# Patient Record
Sex: Female | Born: 1945 | Race: White | Hispanic: No | Marital: Married | Smoking: Never smoker
Health system: Southern US, Community
[De-identification: ages and names within clinical notes are randomized; demographics above are authoritative.]

## PROBLEM LIST (undated history)

## (undated) DIAGNOSIS — F419 Anxiety disorder, unspecified: Secondary | ICD-10-CM

## (undated) DIAGNOSIS — K219 Gastro-esophageal reflux disease without esophagitis: Secondary | ICD-10-CM

## (undated) DIAGNOSIS — G43909 Migraine, unspecified, not intractable, without status migrainosus: Secondary | ICD-10-CM

## (undated) DIAGNOSIS — I10 Essential (primary) hypertension: Secondary | ICD-10-CM

## (undated) DIAGNOSIS — E039 Hypothyroidism, unspecified: Secondary | ICD-10-CM

## (undated) HISTORY — DX: Essential (primary) hypertension: I10

## (undated) HISTORY — DX: Anxiety disorder, unspecified: F41.9

## (undated) HISTORY — PX: APPENDECTOMY: SHX54

## (undated) HISTORY — PX: TUBAL LIGATION: SHX77

## (undated) HISTORY — PX: TONSILLECTOMY: SUR1361

## (undated) HISTORY — PX: ANKLE FRACTURE SURGERY: SHX122

## (undated) HISTORY — DX: Migraine, unspecified, not intractable, without status migrainosus: G43.909

## (undated) HISTORY — PX: CHOLECYSTECTOMY: SHX55

---

## 2002-01-26 ENCOUNTER — Inpatient Hospital Stay (HOSPITAL_COMMUNITY): Admission: EM | Admit: 2002-01-26 | Discharge: 2002-01-28 | Payer: Self-pay | Admitting: Psychiatry

## 2004-01-25 ENCOUNTER — Ambulatory Visit (HOSPITAL_COMMUNITY): Admission: RE | Admit: 2004-01-25 | Discharge: 2004-01-25 | Payer: Self-pay | Admitting: Internal Medicine

## 2004-08-14 ENCOUNTER — Ambulatory Visit: Payer: Self-pay | Admitting: Cardiology

## 2005-09-20 ENCOUNTER — Encounter: Admission: RE | Admit: 2005-09-20 | Discharge: 2005-09-20 | Payer: Self-pay | Admitting: General Surgery

## 2006-03-28 ENCOUNTER — Encounter: Admission: RE | Admit: 2006-03-28 | Discharge: 2006-03-28 | Payer: Self-pay | Admitting: General Surgery

## 2007-04-24 ENCOUNTER — Encounter: Admission: RE | Admit: 2007-04-24 | Discharge: 2007-04-24 | Payer: Self-pay | Admitting: *Deleted

## 2007-04-29 ENCOUNTER — Encounter: Admission: RE | Admit: 2007-04-29 | Discharge: 2007-04-29 | Payer: Self-pay | Admitting: *Deleted

## 2007-12-04 ENCOUNTER — Encounter: Admission: RE | Admit: 2007-12-04 | Discharge: 2007-12-04 | Payer: Self-pay | Admitting: *Deleted

## 2008-08-02 ENCOUNTER — Ambulatory Visit: Payer: Self-pay | Admitting: Cardiology

## 2009-02-10 ENCOUNTER — Encounter: Admission: RE | Admit: 2009-02-10 | Discharge: 2009-02-10 | Payer: Self-pay | Admitting: Unknown Physician Specialty

## 2009-03-29 ENCOUNTER — Encounter: Admission: RE | Admit: 2009-03-29 | Discharge: 2009-03-29 | Payer: Self-pay | Admitting: Unknown Physician Specialty

## 2009-12-18 ENCOUNTER — Ambulatory Visit (HOSPITAL_COMMUNITY): Admission: RE | Admit: 2009-12-18 | Discharge: 2009-12-18 | Payer: Self-pay | Admitting: Ophthalmology

## 2010-04-13 ENCOUNTER — Encounter: Admission: RE | Admit: 2010-04-13 | Discharge: 2010-04-13 | Payer: Self-pay | Admitting: Unknown Physician Specialty

## 2010-10-07 ENCOUNTER — Encounter: Payer: Self-pay | Admitting: Family Medicine

## 2010-10-08 ENCOUNTER — Encounter: Payer: Self-pay | Admitting: Unknown Physician Specialty

## 2011-02-01 NOTE — Op Note (Signed)
NAME:  Amanda Bowman                           ACCOUNT NO.:  [id number]   MEDICAL RECORD NO.:  [id number]                   PATIENT TYPE:  AMB   LOCATION:  DAY                                  FACILITY:  [facility]   PHYSICIAN:  Lionel December, M.D.                 DATE OF BIRTH:  10/04/1945   DATE OF PROCEDURE:  01/25/2004  DATE OF DISCHARGE:                                 OPERATIVE REPORT   PROCEDURE:  Esophagogastroduodenoscopy followed by total colonoscopy.   ENDOSCOPIST:  Lionel December, M.D.   INDICATIONS:  This patient is a 65 year old Caucasian female who has a  chronic GERD requiring PPI for symptom control. She was, therefore, advised  to undergo EGD to make sure that she does not have complicated GERD such as  Barrett's esophagus. She has chronic diarrhea with explosive bowel movements  and has had multiple accidents.  I feel that she has IBS, but she has not  responded well to therapy.  She is also in an age group where she needs to  be screened for colorectal carcinoma.  She is, therefore, undergoing a  colonoscopy.  Procedure and risks were reviewed with the patient and  informed consent was obtained.   PREOPERATIVE MEDICATIONS:  Cetacaine spray for oropharyngeal topical  anesthesia, Demerol 50 mg IV and Versed 13 mg IV in divided dose.   FINDINGS:  Procedure performed in endoscopy suite.  The patient's vital  signs and O2 saturation were monitored during the procedure and remained  stable.   PROCEDURE #1: ESOPHAGOGASTRODUODENOSCOPY:  The patient was placed in the  left lateral recumbent position and Olympus videoscope was passed via the  oropharynx without any difficulty into the esophagus.   ESOPHAGUS:  Mucosa of the esophagus was normal.  Some erythema was noted at  the GE junction along with a 3 cm size sliding hiatal hernia.   STOMACH:  It had a lot of bile in it, but there was no food debris.  The  stomach distended very well with insufflation.  The mucosa at  body, antrum,  pyloric channel as well as angularis, fundus, and cardia were normal.   DUODENUM:  Examination of the bulb and second part of the duodenum was also  normal.   Endoscope was withdrawn and the patient was prepared for procedure #2.   COLONOSCOPY:  Rectal examination performed.  No abnormality noted on  external or digital exam.   Olympus videoscope  was placed in the rectum and advanced under vision into  the sigmoid colon.  Her preparation was felt to fair-to-satisfactory.  She  had a lot of thick liquid stool.  The scope was advanced to the cecum which  was identified by appendiceal stump and ileocecal valve.  Pictures were  taken for the record. There appeared to be mucosal edema at the ascending  colon, transverse, as well as descending and sigmoid colon.  Biopsy was  taken from the ascending colon and sigmoid colon and submitted in separate  containers.  Also I wonder if this abnormal appearance was due to  pigmentation, although this was note felt to be typical.  There was a 3 mm  polyp at transverse colon which was ablated by a cold biopsy.  Rectal mucosa  was normal.   The scope was retroflexed to examine anorectal junction and small  hemorrhoids were noted below the dentate line.  The endoscope was  straightened and withdrawn.  The patient tolerated the procedure well.   FINAL DIAGNOSES:  1. Mild changes of reflux esophagitis limited to the gastroesophageal     junction and a small sliding hiatal hernia.  2. Small polyp ablated by a cold biopsy from the transverse colon.  3. Abnormal appearance to colonic mucosa, possibly indicative of mild     colitis. These changes are not felt to be atypical of pigmentation.     Biopsies taken from ascending and sigmoid colon.  4. Small external hemorrhoids.   RECOMMENDATIONS:  She will continue antireflux measures and Prevacid as  before.  I would like for her to stay on dicyclomine and try to start  Citrucel 1/2 to 1  tablespoonful q.h.s.  Further recommendations will be made  after biopsy reviewed.  If she does have mild change of colitis we will  start her on Asacol, otherwise would consider Lotronex.      ___________________________________________                                            Lionel December, M.D.   NR/MEDQ  D:  01/25/2004  T:  01/25/2004  Job:  [id number]   cc:   Donzetta Sprung  [address], [address]  [place]  [place] [address]  Fax: [phone]

## 2011-02-01 NOTE — H&P (Signed)
[facility]  Patient:    Amanda Bowman, Amanda Bowman Visit Number: [id number] MRN: [id number]          Service Type: PSY Location: 300 [id number] Attending Physician:  Rachael Fee Dictated by:   Candi Leash. Orsini, N.P. Admit Date:  01/27/2002                     Psychiatric Admission Assessment  IDENTIFYING INFORMATION:  This is a 65 year old married white female voluntarily admitted for depression on Jan 26, 2002.  HISTORY OF PRESENT ILLNESS:  The patient presents with a history of depression, feeling "unhappy for some time", making errors at work, feeling confused, having short-term memory loss that she feels is related to her medication.  She feels very sad and blue.  She reports that her work has not changed but she was told she did not get along with other employees and "snapping" at her husband.  Her sleep and appetite has been satisfactory.  She denies any suicidal or homicidal ideation or auditory or visual hallucinations.  Some paranoid ideation, feeling that her co-workers are laughing at her and she feels that they want to get rid of her at work.  She reports she has had a recent MRI in April due to her confusion.  PAST PSYCHIATRIC HISTORY:  First hospitalization to [facility]. No other hospitalizations.  Was seeing Malachi Bonds ______, a therapist, but not currently.  No prior suicide attempts.  SOCIAL HISTORY:  This is a 65 year old married white female.  This is her third marriage.  Married for 13 years.  She has a son and an adult stepson. She lives with her husband.  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  No legal problems.  FAMILY HISTORY:  Father committed suicide when patient was in her 10s.  ALCOHOL/DRUG HISTORY:  Nonsmoker.  Denies any alcohol or substance abuse.  PRIMARY CARE Miciah Shealy:  Dr. Donzetta Sprung of [employer].  MEDICAL PROBLEMS:  Hypertension, GERD, hypercholesterolemia, migraine headaches.  MEDICATIONS:   Atenolol 100 mg every day, aspirin 325 mg q.d., multivitamin, Prevacid 30 mg q.d., Prempro 0.625/2.5 q.d., Norvasc 5 mg q.d., Effexor 150 mg every day (has been on that for one year), Zocor 20 mg q.h.s., Topamax 25 mg, 2 tabs b.i.d., Xanax 0.5 mg t.i.d. p.r.n. (states takes 1-2 per day).  DRUG ALLERGIES:  No known allergies.  PHYSICAL EXAMINATION:  VITAL SIGNS:  The patient is 5 feet 7-1/2 inches tall.  She is 210 pounds. Temperature 97.6, pulse 87, respirations 16, blood pressure 142/74.  GENERAL APPEARANCE:  The patient is a 65 year old Caucasian female in no acute distress.  She is well-developed and appears her stated age.  She is well-groomed, alert and cooperative.  HEENT:  Head is normocephalic.  She can raise her eyebrows.  Her hair is short, blond with some graying, equally distributed.  EOMs are intact bilaterally.  External ear canals are patent.  Hearing is appropriate to conversation.  No sinus tenderness.  No nasal discharge.  Mucosa is moist. Tongue protrudes midline without tremor.  She can clench her teeth and puff out her cheeks.  NECK:  Supple.  No JVD.  Negative lymphadenopathy.  Thyroid is nonpalpable, nontender.  Trachea is midline.  CHEST:  Clear to auscultation.  HEART:  Regular rate and rhythm without murmurs, gallops or rubs.  Carotid pulses are equal and adequate.  BREASTS:  Exam was deferred.  ABDOMEN:  Soft, nontender abdomen.  No CVA tenderness.  MUSCULOSKELETAL:  No joint swelling or deformity.  Good range of motion. Muscle strength and tone is equal bilaterally.  There is no signs of injury.  SKIN:  Warm and dry with good turgor.  Nail beds are pink with good capillary refill.  NEUROLOGIC:  She is oriented x 3.  Cranial nerves are grossly intact.  Good grip strength bilaterally.  Gait is normal.  Cerebellar function intact with heel to shin and normal alternating movements.  LABORATORY DATA:  CBC is within normal limits.  CMET is within  normal limits.  MENTAL STATUS EXAMINATION:  She is an alert, middle-aged, casually dressed female, cooperative with good eye contact.  Speech is normal and relevant. Mood is depressed.  Affect is flat.  Thought processes are coherent.  Some positive paranoid ideation presented during patients interview in regards to her work.  No auditory or visual hallucinations.  No suicidal or homicidal ideation.  No delusions.  Cognitive function intact.  Memory is fair. Judgment and insight are fair.  DIAGNOSES: Axis I:    Major depression, single episode with psychotic features. Axis II:   Deferred. Axis III:  1. Hypertension.            2. Migraine headaches.            3. Gastroesophageal reflux disease. Axis IV:   Problems with occupation. Axis V:    Current 40; estimated this past year 61.  PLAN:  Voluntary admission to [facility] for depression. Contract for safety.  Check every 15 minutes.  Will decrease her depressive symptoms so patient can be safe.  Will resume her routine medications.  Will check her blood pressure and check labs.  We will decrease patients Effexor and treatment plan was discussed with Dr. Dub Mikes.  Will have a family session before discharge.  TENTATIVE LENGTH OF STAY:  Three to five days. Dictated by:   Candi Leash. Orsini, N.P. Attending Physician:  Rachael Fee DD:  01/27/02 TD:  01/27/02 Job: [id number]

## 2011-02-01 NOTE — Discharge Summary (Signed)
[facility]  Patient:    Amanda Bowman, TISON Visit Number: [id number] MRN: [id number]          Service Type: PSY Location: 300 [id number] Attending Physician:  Rachael Fee Dictated by:   Reymundo Poll Dub Mikes, M.D. Admit Date:  01/26/2002 Discharge Date: 01/28/2002                             Discharge Summary  CHIEF COMPLAINT AND HISTORY OF PRESENT ILLNESS:  This was the first admission to [facility] for this 65 year old married white female voluntarily admitted for depression.  History of depression, feeling unhappy for a while, making errors at work, feeling confused, short-term memory loss, feels sad, blue, not getting along with other employees, snapping at the husband, sleeping and appetite changes, but denied any active suicidal or homicidal ideations.  She was feeling desperate as she was in danger of losing her job as well as her family, so she requested help.  Was feeling that the coworkers were laughing at her.  PAST PSYCHIATRIC HISTORY:  No previous inpatient treatment.  Has seen Byrd Hesselbach for therapy.  No medications.  SUBSTANCE ABUSE HISTORY:  Denies the use or abuse of any alcohol or drugs.  PAST MEDICAL HISTORY:  Positive for hypertension, gastroesophageal reflux disease, hypercholesterolemia, migraines.  MEDICATIONS ON ADMISSION:  1. Atenolol 100 mg per day.  2. Aspirin one daily.  3. Multivitamin.  4. Prevacid 30 mg every day.  5. Prempro 0.625/2.5 mg.  6. Norvasc 5 mg daily.  7. Effexor XR 150 mg daily for a year.  8. Zocor 20 mg daily.  9. Topamax 50 mg twice a day. 10. Xanax 0.5 mg three a day as needed for anxiety.  PHYSICAL EXAMINATION:  GENERAL:  Performed and failed to show any acute findings.  MENTAL STATUS EXAMINATION ON ADMISSION:  Well-nourished, well-developed, alert, cooperative female.  Mood: Depressed.  Affect: Depressed.  Thought processes: Coherent; no evidence of psychosis, no  auditory or visual hallucinations, no suicidal ideas, but there was a sense of hopelessness, helplessness, feeling overwhelmed, out of control, not knowing what to do to get herself better.  Cognitive: Well preserved.  ADMITTING DIAGNOSES: Axis I:    Major depression, single episode. Axis II:   No diagnosis. Axis III:  1. Arterial hypertension.            2. Migraines.            3. Gastroesophageal reflux disease. Axis IV:   Moderate. Axis V:    Global assessment of functioning upon admission 40, highest global            assessment of functioning in the last year 70.  LABORATORY DATA:  CBC, urinalysis, electrolytes, liver profile, and thyroid profile were within normal limits.  Drug screen failed to show any substances of abuse.  HOSPITAL COURSE:  She was admitted and started in intensive individual and group psychotherapy.  The issue was if she was sick enough to be at this level of care.  She had not had long-term outpatient treatment.  It was more of frustration that she was not getting any better.  She, indeed, upon further questioning, had used Wellbutrin, Paxil, Zoloft, and Prozac with no benefit. She had been on Effexor 150 mg with not a very good response.  She was wanting long-term treatment and she, through her church, was able to find out there was a [employer] [employer] [facility]  [facility] where, although it was a substance abuse program, they could take people with depression.  So, she was wanting to go there to further work on herself using a religious modality.  DISCHARGE DIAGNOSES: Axis I:    Major depression, single episode. Axis II:   No diagnosis. Axis III:  1. Arterial hypertension.            2. Migraines.            3. Gastroesophageal reflux disease. Axis IV:   Moderate. Axis V:    Global assessment of functioning upon discharge 55-60.  DISCHARGE MEDICATIONS:  1. Prevacid 30 mg daily.  2. Prempro 0.625/2.5 mg daily.  3. Norvasc 5 mg daily.  4. Effexor XR 150 mg  daily.  5. Zocor 20 mg at bedtime.  6. Topamax 50 mg twice a day.  7. Xanax 0.5 mg three times a day.  8. Atenolol 100 mg daily.  9. Aspirin 325 mg daily. 10. Centrum multivitamin one daily. 11. Ambien 10 mg at bedtime for sleep.  FOLLOWUP:  We recommended to be out of work four weeks while she goes to the [employer] and follow up at [facility]. Dictated by:   Reymundo Poll Dub Mikes, M.D. Attending Physician:  Rachael Fee DD:  03/10/02 TD:  03/10/02 Job: [id number]

## 2014-10-12 DIAGNOSIS — J0100 Acute maxillary sinusitis, unspecified: Secondary | ICD-10-CM | POA: Diagnosis not present

## 2014-11-24 DIAGNOSIS — E782 Mixed hyperlipidemia: Secondary | ICD-10-CM | POA: Diagnosis not present

## 2014-11-24 DIAGNOSIS — E8881 Metabolic syndrome: Secondary | ICD-10-CM | POA: Diagnosis not present

## 2014-11-24 DIAGNOSIS — I10 Essential (primary) hypertension: Secondary | ICD-10-CM | POA: Diagnosis not present

## 2014-11-26 DIAGNOSIS — J0100 Acute maxillary sinusitis, unspecified: Secondary | ICD-10-CM | POA: Diagnosis not present

## 2014-11-26 DIAGNOSIS — I10 Essential (primary) hypertension: Secondary | ICD-10-CM | POA: Diagnosis not present

## 2014-11-26 DIAGNOSIS — J301 Allergic rhinitis due to pollen: Secondary | ICD-10-CM | POA: Diagnosis not present

## 2014-12-01 DIAGNOSIS — F331 Major depressive disorder, recurrent, moderate: Secondary | ICD-10-CM | POA: Diagnosis not present

## 2014-12-01 DIAGNOSIS — E782 Mixed hyperlipidemia: Secondary | ICD-10-CM | POA: Diagnosis not present

## 2014-12-01 DIAGNOSIS — I10 Essential (primary) hypertension: Secondary | ICD-10-CM | POA: Diagnosis not present

## 2014-12-01 DIAGNOSIS — E8881 Metabolic syndrome: Secondary | ICD-10-CM | POA: Diagnosis not present

## 2014-12-01 DIAGNOSIS — E6609 Other obesity due to excess calories: Secondary | ICD-10-CM | POA: Diagnosis not present

## 2014-12-06 DIAGNOSIS — R928 Other abnormal and inconclusive findings on diagnostic imaging of breast: Secondary | ICD-10-CM | POA: Diagnosis not present

## 2014-12-06 DIAGNOSIS — Z1231 Encounter for screening mammogram for malignant neoplasm of breast: Secondary | ICD-10-CM | POA: Diagnosis not present

## 2014-12-14 DIAGNOSIS — M542 Cervicalgia: Secondary | ICD-10-CM | POA: Diagnosis not present

## 2014-12-14 DIAGNOSIS — N63 Unspecified lump in breast: Secondary | ICD-10-CM | POA: Diagnosis not present

## 2014-12-14 DIAGNOSIS — R928 Other abnormal and inconclusive findings on diagnostic imaging of breast: Secondary | ICD-10-CM | POA: Diagnosis not present

## 2014-12-14 DIAGNOSIS — M5137 Other intervertebral disc degeneration, lumbosacral region: Secondary | ICD-10-CM | POA: Diagnosis not present

## 2014-12-14 DIAGNOSIS — M47812 Spondylosis without myelopathy or radiculopathy, cervical region: Secondary | ICD-10-CM | POA: Diagnosis not present

## 2014-12-14 DIAGNOSIS — M545 Low back pain: Secondary | ICD-10-CM | POA: Diagnosis not present

## 2015-02-09 DIAGNOSIS — M5416 Radiculopathy, lumbar region: Secondary | ICD-10-CM | POA: Diagnosis not present

## 2015-02-27 DIAGNOSIS — L247 Irritant contact dermatitis due to plants, except food: Secondary | ICD-10-CM | POA: Diagnosis not present

## 2015-03-10 DIAGNOSIS — I10 Essential (primary) hypertension: Secondary | ICD-10-CM | POA: Diagnosis not present

## 2015-03-10 DIAGNOSIS — E782 Mixed hyperlipidemia: Secondary | ICD-10-CM | POA: Diagnosis not present

## 2015-03-23 DIAGNOSIS — F331 Major depressive disorder, recurrent, moderate: Secondary | ICD-10-CM | POA: Diagnosis not present

## 2015-03-23 DIAGNOSIS — E782 Mixed hyperlipidemia: Secondary | ICD-10-CM | POA: Diagnosis not present

## 2015-03-23 DIAGNOSIS — I10 Essential (primary) hypertension: Secondary | ICD-10-CM | POA: Diagnosis not present

## 2015-03-23 DIAGNOSIS — E8881 Metabolic syndrome: Secondary | ICD-10-CM | POA: Diagnosis not present

## 2015-03-23 DIAGNOSIS — E6609 Other obesity due to excess calories: Secondary | ICD-10-CM | POA: Diagnosis not present

## 2015-03-31 DIAGNOSIS — E871 Hypo-osmolality and hyponatremia: Secondary | ICD-10-CM | POA: Diagnosis not present

## 2015-03-31 DIAGNOSIS — E8881 Metabolic syndrome: Secondary | ICD-10-CM | POA: Diagnosis not present

## 2015-03-31 DIAGNOSIS — I10 Essential (primary) hypertension: Secondary | ICD-10-CM | POA: Diagnosis not present

## 2015-04-21 DIAGNOSIS — E871 Hypo-osmolality and hyponatremia: Secondary | ICD-10-CM | POA: Diagnosis not present

## 2015-07-04 DIAGNOSIS — H40053 Ocular hypertension, bilateral: Secondary | ICD-10-CM | POA: Diagnosis not present

## 2015-07-04 DIAGNOSIS — Z23 Encounter for immunization: Secondary | ICD-10-CM | POA: Diagnosis not present

## 2015-08-07 DIAGNOSIS — E8881 Metabolic syndrome: Secondary | ICD-10-CM | POA: Diagnosis not present

## 2015-08-07 DIAGNOSIS — K219 Gastro-esophageal reflux disease without esophagitis: Secondary | ICD-10-CM | POA: Diagnosis not present

## 2015-08-07 DIAGNOSIS — E871 Hypo-osmolality and hyponatremia: Secondary | ICD-10-CM | POA: Diagnosis not present

## 2015-08-07 DIAGNOSIS — E782 Mixed hyperlipidemia: Secondary | ICD-10-CM | POA: Diagnosis not present

## 2015-08-07 DIAGNOSIS — I10 Essential (primary) hypertension: Secondary | ICD-10-CM | POA: Diagnosis not present

## 2015-08-08 DIAGNOSIS — E782 Mixed hyperlipidemia: Secondary | ICD-10-CM | POA: Diagnosis not present

## 2015-08-19 DIAGNOSIS — Z0001 Encounter for general adult medical examination with abnormal findings: Secondary | ICD-10-CM | POA: Diagnosis not present

## 2015-08-19 DIAGNOSIS — F331 Major depressive disorder, recurrent, moderate: Secondary | ICD-10-CM | POA: Diagnosis not present

## 2015-08-31 DIAGNOSIS — J019 Acute sinusitis, unspecified: Secondary | ICD-10-CM | POA: Diagnosis not present

## 2015-10-17 DIAGNOSIS — M5416 Radiculopathy, lumbar region: Secondary | ICD-10-CM | POA: Diagnosis not present

## 2015-10-17 DIAGNOSIS — M5410 Radiculopathy, site unspecified: Secondary | ICD-10-CM | POA: Diagnosis not present

## 2015-10-25 DIAGNOSIS — M1712 Unilateral primary osteoarthritis, left knee: Secondary | ICD-10-CM | POA: Diagnosis not present

## 2015-10-25 DIAGNOSIS — M545 Low back pain: Secondary | ICD-10-CM | POA: Diagnosis not present

## 2015-10-25 DIAGNOSIS — M47817 Spondylosis without myelopathy or radiculopathy, lumbosacral region: Secondary | ICD-10-CM | POA: Diagnosis not present

## 2015-10-31 ENCOUNTER — Ambulatory Visit (HOSPITAL_COMMUNITY): Payer: Self-pay | Admitting: Psychiatry

## 2015-11-06 ENCOUNTER — Ambulatory Visit (HOSPITAL_COMMUNITY): Payer: Self-pay | Admitting: Psychiatry

## 2015-12-04 DIAGNOSIS — J0101 Acute recurrent maxillary sinusitis: Secondary | ICD-10-CM | POA: Diagnosis not present

## 2015-12-04 DIAGNOSIS — R197 Diarrhea, unspecified: Secondary | ICD-10-CM | POA: Diagnosis not present

## 2015-12-05 ENCOUNTER — Encounter (INDEPENDENT_AMBULATORY_CARE_PROVIDER_SITE_OTHER): Payer: Self-pay | Admitting: *Deleted

## 2015-12-22 ENCOUNTER — Ambulatory Visit (INDEPENDENT_AMBULATORY_CARE_PROVIDER_SITE_OTHER): Payer: Self-pay | Admitting: Internal Medicine

## 2015-12-25 DIAGNOSIS — E782 Mixed hyperlipidemia: Secondary | ICD-10-CM | POA: Diagnosis not present

## 2015-12-25 DIAGNOSIS — I10 Essential (primary) hypertension: Secondary | ICD-10-CM | POA: Diagnosis not present

## 2015-12-28 ENCOUNTER — Other Ambulatory Visit (INDEPENDENT_AMBULATORY_CARE_PROVIDER_SITE_OTHER): Payer: Self-pay | Admitting: *Deleted

## 2015-12-28 ENCOUNTER — Encounter (INDEPENDENT_AMBULATORY_CARE_PROVIDER_SITE_OTHER): Payer: Self-pay | Admitting: Internal Medicine

## 2015-12-28 ENCOUNTER — Encounter (INDEPENDENT_AMBULATORY_CARE_PROVIDER_SITE_OTHER): Payer: Self-pay

## 2015-12-28 ENCOUNTER — Encounter (INDEPENDENT_AMBULATORY_CARE_PROVIDER_SITE_OTHER): Payer: Self-pay | Admitting: *Deleted

## 2015-12-28 ENCOUNTER — Other Ambulatory Visit (INDEPENDENT_AMBULATORY_CARE_PROVIDER_SITE_OTHER): Payer: Self-pay | Admitting: Internal Medicine

## 2015-12-28 ENCOUNTER — Ambulatory Visit (INDEPENDENT_AMBULATORY_CARE_PROVIDER_SITE_OTHER): Payer: Medicare Other | Admitting: Internal Medicine

## 2015-12-28 DIAGNOSIS — K529 Noninfective gastroenteritis and colitis, unspecified: Secondary | ICD-10-CM | POA: Diagnosis not present

## 2015-12-28 DIAGNOSIS — I10 Essential (primary) hypertension: Secondary | ICD-10-CM | POA: Insufficient documentation

## 2015-12-28 DIAGNOSIS — K219 Gastro-esophageal reflux disease without esophagitis: Secondary | ICD-10-CM | POA: Insufficient documentation

## 2015-12-28 DIAGNOSIS — F411 Generalized anxiety disorder: Secondary | ICD-10-CM | POA: Insufficient documentation

## 2015-12-28 MED ORDER — PEG 3350-KCL-NA BICARB-NACL 420 G PO SOLR: 4000.0000 mL | Freq: Once | ORAL | Status: AC

## 2015-12-28 MED ORDER — DICYCLOMINE HCL 10 MG PO CAPS: 10.0000 mg | ORAL_CAPSULE | Freq: Two times a day (BID) | ORAL | Status: DC

## 2015-12-28 NOTE — Progress Notes (Signed)
Subjective:    Patient ID: Amanda Bowman, female    DOB: 1946-07-07, 70 y.o.   MRN: [id number]  HPI Referred by Dr. Olena Heckle for chronic diarrhea. She has had diarrhea "off and on " for years. She has urgency. She underwent a colonoscopy about 5 yrs by Dr. Nada Maclachlan and was normal . She tells me she had polyps. I will get records. She wears a Depend at night. She has incontinence at night. This is a new symptoms. She has been wearing Depends off and on for about 5 yrs.  She usually has a BM x 1 a day and sometimes she will skip a day. Sometimes she will have 3 BMs.  Appetite is okay. There has been no weight loss.  There is no abdominal pain.  No recent antibiotics.  If she tries Imodium she will become constipated She has been under a lot of stress in her personal life.    01/25/2004:  Esophagogastroduodenoscopy followed by total colonoscopy.    ENDOSCOPIST:  Hildred Laser, M.D.    INDICATIONS:  This patient is a 70 year old Caucasian female who has a  chronic GERD requiring PPI for symptom control. She was, therefore, advised  to undergo EGD to make sure that she does not have complicated GERD such as  Barrett's esophagus. She has chronic diarrhea with explosive bowel movements  and has had multiple accidents.  I feel that she has IBS, but she has not  responded well to therapy.  She is also in an age group where she needs to  be screened for colorectal carcinoma.     colonoscopy.       FINAL DIAGNOSES:  1. Mild changes of reflux esophagitis limited to the gastroesophageal     junction and a small sliding hiatal hernia.  2. Small polyp ablated by a cold biopsy from the transverse colon.  3. Abnormal appearance to colonic mucosa, possibly indicative of mild     colitis. These changes are not felt to be atypical of pigmentation.     Biopsies taken from ascending and sigmoid colon.  4. Small external hemorrhoids.   Review of Systems Past Medical History  Diagnosis Date  .  Hypertension   . Migraine   . Anxiety     Past Surgical History  Procedure Laterality Date  . Tonsillectomy    . Tubal ligation    . Ankle fracture surgery    . Cholecystectomy    . Appendectomy      Not on File  No current outpatient prescriptions on file prior to visit.   No current facility-administered medications on file prior to visit.   Current Outpatient Prescriptions  Medication Sig Dispense Refill  . ALPRAZolam (XANAX) 0.5 MG tablet Take 0.5 mg by mouth at bedtime as needed for anxiety.    Marland Kitchen aspirin 81 MG tablet Take 81 mg by mouth daily.    . citalopram (CELEXA) 20 MG tablet Take 20 mg by mouth daily.    . cyclobenzaprine (FLEXERIL) 10 MG tablet Take 10 mg by mouth 3 (three) times daily as needed for muscle spasms.    Marland Kitchen levothyroxine (SYNTHROID, LEVOTHROID) 25 MCG tablet Take 25 mcg by mouth daily before breakfast.    . lisinopril-hydrochlorothiazide (PRINZIDE,ZESTORETIC) 20-12.5 MG tablet Take 1 tablet by mouth daily.    . meclizine (ANTIVERT) 25 MG tablet Take 25 mg by mouth 3 (three) times daily as needed for dizziness.    . Multiple Vitamins-Minerals (WOMENS MULTI VITAMIN & MINERAL) TABS Take by mouth.    Marland Kitchen  omeprazole (PRILOSEC) 20 MG capsule Take 20 mg by mouth 2 (two) times daily before a meal.    . promethazine (PHENERGAN) 25 MG tablet Take 25 mg by mouth every 6 (six) hours as needed for nausea or vomiting.    . topiramate (TOPAMAX) 25 MG capsule Take 25 mg by mouth 2 (two) times daily.    . traZODone (DESYREL) 50 MG tablet Take 200 mg by mouth.     No current facility-administered medications for this visit.         Objective:   Physical ExamBlood pressure 150/82, pulse 64, temperature 98.2 F (36.8 C), height 5' 7.5" (1.715 m), weight 170 lb 1.6 oz (77.157 kg). Alert and oriented. Skin warm and dry. Oral mucosa is moist.   . Sclera anicteric, conjunctivae is pink. Thyroid not enlarged. No cervical lymphadenopathy. Lungs clear. Heart regular rate and  rhythm.  Abdomen is soft. Bowel sounds are positive. No hepatomegaly. No abdominal masses felt. No tenderness.  No edema to lower extremities.          Assessment & Plan:  Probable IBS. She would like to proceed with a colonoscopy. She says her symptoms have worsened. Same symptoms for year. The risks and benefits such as perforation, bleeding, and infection were reviewed with the patient and is agreeable.

## 2015-12-28 NOTE — Telephone Encounter (Signed)
Patient needs trilyte 

## 2015-12-28 NOTE — Patient Instructions (Signed)
Colonoscopy.  The risks and benefits such as perforation, bleeding, and infection were reviewed with the patient and is agreeable. 

## 2016-01-03 DIAGNOSIS — N6002 Solitary cyst of left breast: Secondary | ICD-10-CM | POA: Diagnosis not present

## 2016-01-03 DIAGNOSIS — N63 Unspecified lump in breast: Secondary | ICD-10-CM | POA: Diagnosis not present

## 2016-01-03 DIAGNOSIS — R42 Dizziness and giddiness: Secondary | ICD-10-CM | POA: Diagnosis not present

## 2016-01-08 ENCOUNTER — Telehealth (INDEPENDENT_AMBULATORY_CARE_PROVIDER_SITE_OTHER): Payer: Self-pay | Admitting: Internal Medicine

## 2016-01-08 NOTE — Telephone Encounter (Signed)
Patient called, states she is scheduled for a colonoscopy on June 7 at 1:10pm.  Stated the medication you gave her on her last visit has helped tremendously. She doesn't have insurance or the funds to have the colonoscopy and wants to know if you are ok with her cancelling it.  She would like a return call.  [phone]

## 2016-01-09 NOTE — Telephone Encounter (Signed)
TCS canceled 

## 2016-01-09 NOTE — Telephone Encounter (Signed)
Patient would like to cancel colonoscopy.  

## 2016-01-23 ENCOUNTER — Encounter (INDEPENDENT_AMBULATORY_CARE_PROVIDER_SITE_OTHER): Payer: Self-pay

## 2016-01-29 DIAGNOSIS — E782 Mixed hyperlipidemia: Secondary | ICD-10-CM | POA: Diagnosis not present

## 2016-01-29 DIAGNOSIS — I10 Essential (primary) hypertension: Secondary | ICD-10-CM | POA: Diagnosis not present

## 2016-02-21 ENCOUNTER — Ambulatory Visit (HOSPITAL_COMMUNITY): Admit: 2016-02-21 | Payer: Self-pay | Admitting: Internal Medicine

## 2016-02-21 ENCOUNTER — Encounter (HOSPITAL_COMMUNITY): Payer: Self-pay

## 2016-02-21 SURGERY — COLONOSCOPY
Anesthesia: Moderate Sedation

## 2016-03-01 DIAGNOSIS — M1712 Unilateral primary osteoarthritis, left knee: Secondary | ICD-10-CM | POA: Diagnosis not present

## 2016-03-20 DIAGNOSIS — M19042 Primary osteoarthritis, left hand: Secondary | ICD-10-CM | POA: Diagnosis not present

## 2016-03-20 DIAGNOSIS — M19041 Primary osteoarthritis, right hand: Secondary | ICD-10-CM | POA: Diagnosis not present

## 2016-05-01 DIAGNOSIS — E782 Mixed hyperlipidemia: Secondary | ICD-10-CM | POA: Diagnosis not present

## 2016-05-01 DIAGNOSIS — I10 Essential (primary) hypertension: Secondary | ICD-10-CM | POA: Diagnosis not present

## 2016-05-09 DIAGNOSIS — M5116 Intervertebral disc disorders with radiculopathy, lumbar region: Secondary | ICD-10-CM | POA: Diagnosis not present

## 2016-05-09 DIAGNOSIS — M545 Low back pain: Secondary | ICD-10-CM | POA: Diagnosis not present

## 2016-05-31 DIAGNOSIS — J0100 Acute maxillary sinusitis, unspecified: Secondary | ICD-10-CM | POA: Diagnosis not present

## 2016-06-17 DIAGNOSIS — H40053 Ocular hypertension, bilateral: Secondary | ICD-10-CM | POA: Diagnosis not present

## 2016-06-19 DIAGNOSIS — Z23 Encounter for immunization: Secondary | ICD-10-CM | POA: Diagnosis not present

## 2016-09-02 DIAGNOSIS — E782 Mixed hyperlipidemia: Secondary | ICD-10-CM | POA: Diagnosis not present

## 2016-09-02 DIAGNOSIS — I10 Essential (primary) hypertension: Secondary | ICD-10-CM | POA: Diagnosis not present

## 2016-09-02 DIAGNOSIS — Z1212 Encounter for screening for malignant neoplasm of rectum: Secondary | ICD-10-CM | POA: Diagnosis not present

## 2016-09-02 DIAGNOSIS — Z23 Encounter for immunization: Secondary | ICD-10-CM | POA: Diagnosis not present

## 2016-09-02 DIAGNOSIS — Z0001 Encounter for general adult medical examination with abnormal findings: Secondary | ICD-10-CM | POA: Diagnosis not present

## 2016-09-25 DIAGNOSIS — J0110 Acute frontal sinusitis, unspecified: Secondary | ICD-10-CM | POA: Diagnosis not present

## 2016-11-01 DIAGNOSIS — J019 Acute sinusitis, unspecified: Secondary | ICD-10-CM | POA: Diagnosis not present

## 2016-11-01 DIAGNOSIS — R05 Cough: Secondary | ICD-10-CM | POA: Diagnosis not present

## 2017-01-08 DIAGNOSIS — N6322 Unspecified lump in the left breast, upper inner quadrant: Secondary | ICD-10-CM | POA: Diagnosis not present

## 2017-01-14 DIAGNOSIS — I10 Essential (primary) hypertension: Secondary | ICD-10-CM | POA: Diagnosis not present

## 2017-01-14 DIAGNOSIS — Z23 Encounter for immunization: Secondary | ICD-10-CM | POA: Diagnosis not present

## 2017-01-14 DIAGNOSIS — E782 Mixed hyperlipidemia: Secondary | ICD-10-CM | POA: Diagnosis not present

## 2017-02-26 DIAGNOSIS — E782 Mixed hyperlipidemia: Secondary | ICD-10-CM | POA: Diagnosis not present

## 2017-02-26 DIAGNOSIS — I10 Essential (primary) hypertension: Secondary | ICD-10-CM | POA: Diagnosis not present

## 2017-03-25 DIAGNOSIS — D485 Neoplasm of uncertain behavior of skin: Secondary | ICD-10-CM | POA: Diagnosis not present

## 2017-03-25 DIAGNOSIS — D216 Benign neoplasm of connective and other soft tissue of trunk, unspecified: Secondary | ICD-10-CM | POA: Diagnosis not present

## 2017-04-04 DIAGNOSIS — D210 Benign neoplasm of connective and other soft tissue of head, face and neck: Secondary | ICD-10-CM | POA: Diagnosis not present

## 2017-04-09 DIAGNOSIS — G43909 Migraine, unspecified, not intractable, without status migrainosus: Secondary | ICD-10-CM | POA: Diagnosis not present

## 2017-05-07 ENCOUNTER — Telehealth (INDEPENDENT_AMBULATORY_CARE_PROVIDER_SITE_OTHER): Payer: Self-pay | Admitting: Physical Medicine and Rehabilitation

## 2017-05-08 NOTE — Telephone Encounter (Signed)
Scheduled for 05/21/17 at 1300.

## 2017-05-08 NOTE — Telephone Encounter (Signed)
Yes ok unless HTA/Human etc then OV

## 2017-05-21 ENCOUNTER — Ambulatory Visit (INDEPENDENT_AMBULATORY_CARE_PROVIDER_SITE_OTHER): Payer: Self-pay

## 2017-05-21 ENCOUNTER — Ambulatory Visit (INDEPENDENT_AMBULATORY_CARE_PROVIDER_SITE_OTHER): Payer: Medicare Other | Admitting: Physical Medicine and Rehabilitation

## 2017-05-21 ENCOUNTER — Encounter (INDEPENDENT_AMBULATORY_CARE_PROVIDER_SITE_OTHER): Payer: Self-pay | Admitting: Physical Medicine and Rehabilitation

## 2017-05-21 VITALS — BP 154/94 | HR 69

## 2017-05-21 DIAGNOSIS — M5116 Intervertebral disc disorders with radiculopathy, lumbar region: Secondary | ICD-10-CM | POA: Diagnosis not present

## 2017-05-21 DIAGNOSIS — M5416 Radiculopathy, lumbar region: Secondary | ICD-10-CM | POA: Diagnosis not present

## 2017-05-21 MED ORDER — LIDOCAINE HCL (PF) 1 % IJ SOLN
2.0000 mL | Freq: Once | INTRAMUSCULAR | Status: AC
  Administered 2017-05-21: 2 mL

## 2017-05-21 MED ORDER — BETAMETHASONE SOD PHOS & ACET 6 (3-3) MG/ML IJ SUSP
12.0000 mg | Freq: Once | INTRAMUSCULAR | Status: AC
  Administered 2017-05-21: 12 mg

## 2017-05-21 NOTE — Progress Notes (Deleted)
Pain across low back and both knees. Right=left. Comes and goes. Stiffness first thing in the morning. Pain is worse after sitting long periods and goes to stand. No pain with walking. No numbness and tingling. Denies pain in thighs.

## 2017-05-21 NOTE — Patient Instructions (Signed)

## 2017-05-22 DIAGNOSIS — E871 Hypo-osmolality and hyponatremia: Secondary | ICD-10-CM | POA: Diagnosis not present

## 2017-05-22 DIAGNOSIS — I10 Essential (primary) hypertension: Secondary | ICD-10-CM | POA: Diagnosis not present

## 2017-05-22 DIAGNOSIS — E782 Mixed hyperlipidemia: Secondary | ICD-10-CM | POA: Diagnosis not present

## 2017-05-26 ENCOUNTER — Encounter (INDEPENDENT_AMBULATORY_CARE_PROVIDER_SITE_OTHER): Payer: Self-pay | Admitting: Physical Medicine and Rehabilitation

## 2017-05-26 DIAGNOSIS — E782 Mixed hyperlipidemia: Secondary | ICD-10-CM | POA: Diagnosis not present

## 2017-05-26 DIAGNOSIS — Z23 Encounter for immunization: Secondary | ICD-10-CM | POA: Diagnosis not present

## 2017-05-26 DIAGNOSIS — I10 Essential (primary) hypertension: Secondary | ICD-10-CM | POA: Diagnosis not present

## 2017-05-26 NOTE — Procedures (Signed)
Lumbosacral Transforaminal Epidural Steroid Injection - Sub-Pedicular Approach with Fluoroscopic Guidance  Patient: Amanda Bowman      Date of Birth: Sep 09, 1946 MRN: [id number] PCP: Caryl Bis, MD      Visit Date: 05/21/2017   Universal Protocol:    Date/Time: 05/21/2017  Consent Given By: the patient  Position: PRONE  Additional Comments: Vital signs were monitored before and after the procedure. Patient was prepped and draped in the usual sterile fashion. The correct patient, procedure, and site was verified.   Injection Procedure Details:  Procedure Site One Meds Administered:  Meds ordered this encounter  Medications  . lidocaine (PF) (XYLOCAINE) 1 % injection 2 mL  . betamethasone acetate-betamethasone sodium phosphate (CELESTONE) injection 12 mg    Laterality: Bilateral  Location/Site:  L4-L5  Needle size: 22 G  Needle type: Spinal  Needle Placement: Transforaminal  Findings:  -Contrast Used: 1 mL iohexol 180 mg iodine/mL   -Comments: Excellent flow of contrast along the nerve and into the epidural space.  Procedure Details: After squaring off the end-plates to get a true AP view, the C-arm was positioned so that an oblique view of the foramen as noted above was visualized. The target area is just inferior to the "nose of the scotty dog" or sub pedicular. The soft tissues overlying this structure were infiltrated with 2-3 ml. of 1% Lidocaine without Epinephrine.  The spinal needle was inserted toward the target using a "trajectory" view along the fluoroscope beam.  Under AP and lateral visualization, the needle was advanced so it did not puncture dura and was located close the 6 O'Clock position of the pedical in AP tracterory. Biplanar projections were used to confirm position. Aspiration was confirmed to be negative for CSF and/or blood. A 1-2 ml. volume of Isovue-250 was injected and flow of contrast was noted at each level. Radiographs were obtained for  documentation purposes.   After attaining the desired flow of contrast documented above, a 0.5 to 1.0 ml test dose of 0.25% Marcaine was injected into each respective transforaminal space.  The patient was observed for 90 seconds post injection.  After no sensory deficits were reported, and normal lower extremity motor function was noted,   the above injectate was administered so that equal amounts of the injectate were placed at each foramen (level) into the transforaminal epidural space.   Additional Comments:  The patient tolerated the procedure well Dressing: Band-Aid    Post-procedure details: Patient was observed during the procedure. Post-procedure instructions were reviewed.  Patient left the clinic in stable condition.

## 2017-05-26 NOTE — Progress Notes (Signed)
Amanda Bowman - 71 y.o. female MRN [id number]  Date of birth: Aug 21, 1946  Office Visit Note: Visit Date: 05/21/2017 PCP: Caryl Bis, MD Referred by: Caryl Bis, MD  Subjective: Chief Complaint  Patient presents with  . Lower Back - Pain  . Right Knee - Pain  . Left Knee - Pain   HPI: Amanda Bowman is a 71 year old female that I last saw on August 2017 he completed bilateral L4 transforaminal epidural steroid injection with good relief of her symptoms up until just recently. She's had no other big medical issues since that time. He reports no trauma or falls. 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 She is followed by Dr. Durward Fortes usually at the clinic. She's had no real orthopedic complaints. For midback pain standpoint she reports pain across the back and referring down to both knees. She doesn't really relate that the knees are related to the back. Right is equal to left and he can come and go at times. She has a lot of stiffness first thing in the morning a lot of pain going from sit to stand and pain with standing. She has no pain with walking however. She has no paresthesias or numbness or tingling. Nothing down the legs and of the feet. She does get some referral again to the hips and that she also has knee pain. 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 There was mild arthropathy throughout the that was 3 years ago. She's had physical therapy in the past. She does use anti-inflammatories and Tylenol.    Review of Systems  Constitutional: Negative for chills, fever, malaise/fatigue and weight loss.  HENT: Negative for hearing loss and sinus pain.   Eyes: Negative for blurred vision, double vision and photophobia.  Respiratory: Negative for cough and shortness of breath.   Cardiovascular: Negative for chest pain, palpitations and leg swelling.  Gastrointestinal: Negative for abdominal pain, nausea  and vomiting.  Genitourinary: Negative for flank pain.  Musculoskeletal: Positive for back pain and joint pain. Negative for myalgias.  Skin: Negative for itching and rash.  Neurological: Negative for tremors, focal weakness and weakness.  Endo/Heme/Allergies: Negative.   Psychiatric/Behavioral: Negative for depression.  All other systems reviewed and are negative.  Otherwise per HPI.  Assessment & Plan: Visit Diagnoses:  1. Lumbar radiculopathy   2. Radiculopathy due to lumbar intervertebral disc disorder     Plan: Findings:  Chronic history of low back pain pain relieved with epidural injection at L4 which was more radicular pain in the past but now is more axial. MRI evidence more of disc pathology at L4-5 than real facet pathology or stenosis. She clearly has more arthritis seen on x-ray imaging in the MRI itself. I do think she has moderate facet changes at L4-5. I think her pain is multifactorial but she did do extremely well with prior bilateral L4 transforaminal injection in August of last year was really had no difficulty since then up until recently. I think the best approach is bilateral L4 transforaminal injection diagnostically. Depending on the relief she gets I would look at facet joint blocks and we discussed this at length. Greater than 50% of this visit (30 minute duration of visit) was spent in counseling and coordination of care discussing disc herniation and facet arthropathy and knee pain. For her knee pain she should follow up with Dr. Durward Fortes I think he  will has been following that quite extensively in the past. We talked about conservative care for her knees.    Meds & Orders:  Meds ordered this encounter  Medications  . lidocaine (PF) (XYLOCAINE) 1 % injection 2 mL  . betamethasone acetate-betamethasone sodium phosphate (CELESTONE) injection 12 mg    Orders Placed This Encounter  Procedures  . XR C-ARM NO REPORT  . Epidural Steroid injection    Follow-up:  Return if symptoms worsen or fail to improve.   Procedures: No procedures performed  Lumbosacral Transforaminal Epidural Steroid Injection - Sub-Pedicular Approach with Fluoroscopic Guidance  Patient: Amanda Bowman      Date of Birth: Nov 23, 1945 MRN: [id number] PCP: Caryl Bis, MD      Visit Date: 05/21/2017   Universal Protocol:    Date/Time: 05/21/2017  Consent Given By: the patient  Position: PRONE  Additional Comments: Vital signs were monitored before and after the procedure. Patient was prepped and draped in the usual sterile fashion. The correct patient, procedure, and site was verified.   Injection Procedure Details:  Procedure Site One Meds Administered:  Meds ordered this encounter  Medications  . lidocaine (PF) (XYLOCAINE) 1 % injection 2 mL  . betamethasone acetate-betamethasone sodium phosphate (CELESTONE) injection 12 mg    Laterality: Bilateral  Location/Site:  L4-L5  Needle size: 22 G  Needle type: Spinal  Needle Placement: Transforaminal  Findings:  -Contrast Used: 1 mL iohexol 180 mg iodine/mL   -Comments: Excellent flow of contrast along the nerve and into the epidural space.  Procedure Details: After squaring off the end-plates to get a true AP view, the C-arm was positioned so that an oblique view of the foramen as noted above was visualized. The target area is just inferior to the "nose of the scotty dog" or sub pedicular. The soft tissues overlying this structure were infiltrated with 2-3 ml. of 1% Lidocaine without Epinephrine.  The spinal needle was inserted toward the target using a "trajectory" view along the fluoroscope beam.  Under AP and lateral visualization, the needle was advanced so it did not puncture dura and was located close the 6 O'Clock position of the pedical in AP tracterory. Biplanar projections were used to confirm position. Aspiration was confirmed to be negative for CSF and/or blood. A 1-2 ml. volume of Isovue-250 was  injected and flow of contrast was noted at each level. Radiographs were obtained for documentation purposes.   After attaining the desired flow of contrast documented above, a 0.5 to 1.0 ml test dose of 0.25% Marcaine was injected into each respective transforaminal space.  The patient was observed for 90 seconds post injection.  After no sensory deficits were reported, and normal lower extremity motor function was noted,   the above injectate was administered so that equal amounts of the injectate were placed at each foramen (level) into the transforaminal epidural space.   Additional Comments:  The patient tolerated the procedure well Dressing: Band-Aid    Post-procedure details: Patient was observed during the procedure. Post-procedure instructions were reviewed.  Patient left the clinic in stable condition.       Clinical History: Lumbar spine MRI dated 2015 shows L4-5 central broad herniation with lateral recess narrowing and mild facet arthropathy throughout.  She reports that she has never smoked. She has never used smokeless tobacco. No results for input(s): HGBA1C, LABURIC in the last 8760 hours.  Objective:  VS:  HT:    WT:   BMI:     BP:(!) 154/94  HR:69bpm  TEMP: ( )  RESP:98 % Physical Exam  Constitutional: She is oriented to person, place, and time. She appears well-developed and well-nourished. No distress.  HENT:  Head: Normocephalic and atraumatic.  Nose: Nose normal.  Mouth/Throat: Oropharynx is clear and moist.  Eyes: Pupils are equal, round, and reactive to light. Conjunctivae are normal.  Neck: Normal range of motion. Neck supple.  Cardiovascular: Regular rhythm and intact distal pulses.   Pulmonary/Chest: Effort normal. No respiratory distress.  Abdominal: She exhibits no distension. There is no guarding.  Musculoskeletal:  Examination of her knees shows no varus or valgus instability. There is some joint line tenderness. There is good distal strength  without clonus in the bilateral lower extremities. No pain with hip rotation. She has pain with extension rotation of the lumbar spine.  Neurological: She is alert and oriented to person, place, and time. She exhibits normal muscle tone. Coordination normal.  Skin: Skin is warm. No rash noted. No erythema.  Psychiatric: She has a normal mood and affect. Her behavior is normal.  Nursing note and vitals reviewed.   Ortho Exam Imaging: No results found.  Past Medical/Family/Surgical/Social History: Medications & Allergies reviewed per EMR Patient Active Problem List   Diagnosis Date Noted  . Generalized anxiety disorder 12/28/2015  . GERD (gastroesophageal reflux disease) 12/28/2015  . Essential hypertension 12/28/2015   Past Medical History:  Diagnosis Date  . Anxiety   . Hypertension   . Migraine    History reviewed. No pertinent family history. Past Surgical History:  Procedure Laterality Date  . ANKLE FRACTURE SURGERY    . APPENDECTOMY    . CHOLECYSTECTOMY    . TONSILLECTOMY    . TUBAL LIGATION     Social History   Occupational History  . Not on file.   Social History Main Topics  . Smoking status: Never Smoker  . Smokeless tobacco: Never Used  . Alcohol use No  . Drug use: No  . Sexual activity: Not on file

## 2017-06-04 DIAGNOSIS — E782 Mixed hyperlipidemia: Secondary | ICD-10-CM | POA: Diagnosis not present

## 2017-06-04 DIAGNOSIS — E871 Hypo-osmolality and hyponatremia: Secondary | ICD-10-CM | POA: Diagnosis not present

## 2017-06-11 DIAGNOSIS — E871 Hypo-osmolality and hyponatremia: Secondary | ICD-10-CM | POA: Diagnosis not present

## 2017-06-23 DIAGNOSIS — M810 Age-related osteoporosis without current pathological fracture: Secondary | ICD-10-CM | POA: Diagnosis not present

## 2017-06-25 DIAGNOSIS — E871 Hypo-osmolality and hyponatremia: Secondary | ICD-10-CM | POA: Diagnosis not present

## 2017-08-14 DIAGNOSIS — R05 Cough: Secondary | ICD-10-CM | POA: Diagnosis not present

## 2017-08-14 DIAGNOSIS — J0100 Acute maxillary sinusitis, unspecified: Secondary | ICD-10-CM | POA: Diagnosis not present

## 2017-08-19 DIAGNOSIS — R05 Cough: Secondary | ICD-10-CM | POA: Diagnosis not present

## 2017-08-19 DIAGNOSIS — J0100 Acute maxillary sinusitis, unspecified: Secondary | ICD-10-CM | POA: Diagnosis not present

## 2017-09-11 DIAGNOSIS — I10 Essential (primary) hypertension: Secondary | ICD-10-CM | POA: Diagnosis not present

## 2017-09-11 DIAGNOSIS — Z0001 Encounter for general adult medical examination with abnormal findings: Secondary | ICD-10-CM | POA: Diagnosis not present

## 2017-09-11 DIAGNOSIS — E871 Hypo-osmolality and hyponatremia: Secondary | ICD-10-CM | POA: Diagnosis not present

## 2017-09-11 DIAGNOSIS — E782 Mixed hyperlipidemia: Secondary | ICD-10-CM | POA: Diagnosis not present

## 2017-09-13 DIAGNOSIS — Z0001 Encounter for general adult medical examination with abnormal findings: Secondary | ICD-10-CM | POA: Diagnosis not present

## 2017-09-13 DIAGNOSIS — E782 Mixed hyperlipidemia: Secondary | ICD-10-CM | POA: Diagnosis not present

## 2017-09-23 DIAGNOSIS — H40053 Ocular hypertension, bilateral: Secondary | ICD-10-CM | POA: Diagnosis not present

## 2017-10-02 DIAGNOSIS — E782 Mixed hyperlipidemia: Secondary | ICD-10-CM | POA: Diagnosis not present

## 2017-10-02 DIAGNOSIS — I10 Essential (primary) hypertension: Secondary | ICD-10-CM | POA: Diagnosis not present

## 2017-10-02 DIAGNOSIS — E871 Hypo-osmolality and hyponatremia: Secondary | ICD-10-CM | POA: Diagnosis not present

## 2017-10-09 DIAGNOSIS — I10 Essential (primary) hypertension: Secondary | ICD-10-CM | POA: Diagnosis not present

## 2017-10-09 DIAGNOSIS — E871 Hypo-osmolality and hyponatremia: Secondary | ICD-10-CM | POA: Diagnosis not present

## 2017-10-09 DIAGNOSIS — E782 Mixed hyperlipidemia: Secondary | ICD-10-CM | POA: Diagnosis not present

## 2017-10-22 DIAGNOSIS — Z9049 Acquired absence of other specified parts of digestive tract: Secondary | ICD-10-CM | POA: Diagnosis not present

## 2017-10-22 DIAGNOSIS — R945 Abnormal results of liver function studies: Secondary | ICD-10-CM | POA: Diagnosis not present

## 2017-10-22 DIAGNOSIS — R7989 Other specified abnormal findings of blood chemistry: Secondary | ICD-10-CM | POA: Diagnosis not present

## 2017-10-22 DIAGNOSIS — K838 Other specified diseases of biliary tract: Secondary | ICD-10-CM | POA: Diagnosis not present

## 2017-10-23 DIAGNOSIS — E871 Hypo-osmolality and hyponatremia: Secondary | ICD-10-CM | POA: Diagnosis not present

## 2017-12-09 DIAGNOSIS — I10 Essential (primary) hypertension: Secondary | ICD-10-CM | POA: Diagnosis not present

## 2017-12-09 DIAGNOSIS — E782 Mixed hyperlipidemia: Secondary | ICD-10-CM | POA: Diagnosis not present

## 2018-03-05 DIAGNOSIS — Z9189 Other specified personal risk factors, not elsewhere classified: Secondary | ICD-10-CM | POA: Diagnosis not present

## 2018-03-05 DIAGNOSIS — E039 Hypothyroidism, unspecified: Secondary | ICD-10-CM | POA: Diagnosis not present

## 2018-03-05 DIAGNOSIS — I10 Essential (primary) hypertension: Secondary | ICD-10-CM | POA: Diagnosis not present

## 2018-03-05 DIAGNOSIS — E871 Hypo-osmolality and hyponatremia: Secondary | ICD-10-CM | POA: Diagnosis not present

## 2018-03-05 DIAGNOSIS — K219 Gastro-esophageal reflux disease without esophagitis: Secondary | ICD-10-CM | POA: Diagnosis not present

## 2018-03-05 DIAGNOSIS — E782 Mixed hyperlipidemia: Secondary | ICD-10-CM | POA: Diagnosis not present

## 2018-03-10 DIAGNOSIS — I10 Essential (primary) hypertension: Secondary | ICD-10-CM | POA: Diagnosis not present

## 2018-03-10 DIAGNOSIS — E782 Mixed hyperlipidemia: Secondary | ICD-10-CM | POA: Diagnosis not present

## 2018-03-17 DIAGNOSIS — Z86010 Personal history of colonic polyps: Secondary | ICD-10-CM | POA: Diagnosis not present

## 2018-03-25 DIAGNOSIS — I10 Essential (primary) hypertension: Secondary | ICD-10-CM | POA: Diagnosis not present

## 2018-03-25 DIAGNOSIS — Z7951 Long term (current) use of inhaled steroids: Secondary | ICD-10-CM | POA: Diagnosis not present

## 2018-03-25 DIAGNOSIS — E785 Hyperlipidemia, unspecified: Secondary | ICD-10-CM | POA: Diagnosis not present

## 2018-03-25 DIAGNOSIS — Z1211 Encounter for screening for malignant neoplasm of colon: Secondary | ICD-10-CM | POA: Diagnosis not present

## 2018-03-25 DIAGNOSIS — Z86010 Personal history of colonic polyps: Secondary | ICD-10-CM | POA: Diagnosis not present

## 2018-03-25 DIAGNOSIS — M1990 Unspecified osteoarthritis, unspecified site: Secondary | ICD-10-CM | POA: Diagnosis not present

## 2018-03-25 DIAGNOSIS — G43909 Migraine, unspecified, not intractable, without status migrainosus: Secondary | ICD-10-CM | POA: Diagnosis not present

## 2018-03-25 DIAGNOSIS — Z79899 Other long term (current) drug therapy: Secondary | ICD-10-CM | POA: Diagnosis not present

## 2018-03-25 DIAGNOSIS — K219 Gastro-esophageal reflux disease without esophagitis: Secondary | ICD-10-CM | POA: Diagnosis not present

## 2018-04-24 DIAGNOSIS — R42 Dizziness and giddiness: Secondary | ICD-10-CM | POA: Diagnosis not present

## 2018-04-24 DIAGNOSIS — M545 Low back pain: Secondary | ICD-10-CM | POA: Diagnosis not present

## 2018-06-08 DIAGNOSIS — R945 Abnormal results of liver function studies: Secondary | ICD-10-CM | POA: Diagnosis not present

## 2018-06-08 DIAGNOSIS — I10 Essential (primary) hypertension: Secondary | ICD-10-CM | POA: Diagnosis not present

## 2018-06-08 DIAGNOSIS — E871 Hypo-osmolality and hyponatremia: Secondary | ICD-10-CM | POA: Diagnosis not present

## 2018-06-08 DIAGNOSIS — K219 Gastro-esophageal reflux disease without esophagitis: Secondary | ICD-10-CM | POA: Diagnosis not present

## 2018-06-11 DIAGNOSIS — E782 Mixed hyperlipidemia: Secondary | ICD-10-CM | POA: Diagnosis not present

## 2018-06-11 DIAGNOSIS — Z23 Encounter for immunization: Secondary | ICD-10-CM | POA: Diagnosis not present

## 2018-06-11 DIAGNOSIS — J301 Allergic rhinitis due to pollen: Secondary | ICD-10-CM | POA: Diagnosis not present

## 2018-06-11 DIAGNOSIS — I10 Essential (primary) hypertension: Secondary | ICD-10-CM | POA: Diagnosis not present

## 2018-06-23 DIAGNOSIS — Z1231 Encounter for screening mammogram for malignant neoplasm of breast: Secondary | ICD-10-CM | POA: Diagnosis not present

## 2018-09-03 DIAGNOSIS — K219 Gastro-esophageal reflux disease without esophagitis: Secondary | ICD-10-CM | POA: Diagnosis not present

## 2018-09-03 DIAGNOSIS — E871 Hypo-osmolality and hyponatremia: Secondary | ICD-10-CM | POA: Diagnosis not present

## 2018-09-03 DIAGNOSIS — I10 Essential (primary) hypertension: Secondary | ICD-10-CM | POA: Diagnosis not present

## 2018-09-03 DIAGNOSIS — E782 Mixed hyperlipidemia: Secondary | ICD-10-CM | POA: Diagnosis not present

## 2018-09-03 DIAGNOSIS — Z9189 Other specified personal risk factors, not elsewhere classified: Secondary | ICD-10-CM | POA: Diagnosis not present

## 2018-09-07 DIAGNOSIS — Z0001 Encounter for general adult medical examination with abnormal findings: Secondary | ICD-10-CM | POA: Diagnosis not present

## 2018-09-07 DIAGNOSIS — J301 Allergic rhinitis due to pollen: Secondary | ICD-10-CM | POA: Diagnosis not present

## 2018-09-07 DIAGNOSIS — I10 Essential (primary) hypertension: Secondary | ICD-10-CM | POA: Diagnosis not present

## 2018-09-07 DIAGNOSIS — E782 Mixed hyperlipidemia: Secondary | ICD-10-CM | POA: Diagnosis not present

## 2018-12-09 ENCOUNTER — Ambulatory Visit (INDEPENDENT_AMBULATORY_CARE_PROVIDER_SITE_OTHER): Payer: Medicare Other | Admitting: Orthopaedic Surgery

## 2018-12-30 ENCOUNTER — Encounter (INDEPENDENT_AMBULATORY_CARE_PROVIDER_SITE_OTHER): Payer: Self-pay | Admitting: Orthopaedic Surgery

## 2018-12-30 ENCOUNTER — Other Ambulatory Visit: Payer: Self-pay

## 2018-12-30 ENCOUNTER — Ambulatory Visit (INDEPENDENT_AMBULATORY_CARE_PROVIDER_SITE_OTHER): Payer: Self-pay

## 2018-12-30 ENCOUNTER — Ambulatory Visit (INDEPENDENT_AMBULATORY_CARE_PROVIDER_SITE_OTHER): Payer: Medicare Other | Admitting: Orthopaedic Surgery

## 2018-12-30 VITALS — Ht 67.5 in | Wt 170.0 lb

## 2018-12-30 DIAGNOSIS — M25512 Pain in left shoulder: Secondary | ICD-10-CM | POA: Diagnosis not present

## 2018-12-30 DIAGNOSIS — M542 Cervicalgia: Secondary | ICD-10-CM | POA: Diagnosis not present

## 2018-12-30 MED ORDER — METHYLPREDNISOLONE 4 MG PO TBPK: ORAL_TABLET | ORAL | 0 refills | Status: DC

## 2018-12-30 NOTE — Progress Notes (Signed)
Office Visit Note   Patient: Amanda Bowman           Date of Birth: 04-29-1946           MRN: [id number] Visit Date: 12/30/2018              Requested by: Caryl Bis, MD [place], [place] [address] PCP: Caryl Bis, MD   Assessment & Plan: Visit Diagnoses:  1. Acute pain of left shoulder   2. Cervicalgia     Plan: Cervical spine pain with some referred discomfort to left upper extremity.  I believe the problem originates in the cervical spine.  Could have a very small disc herniation.  Neurologically intact.  We will try a Medrol Dosepak and cervical spine exercises and reevaluate in 2 to 3 weeks  Follow-Up Instructions: Return in about 2 weeks (around 01/13/2019).   Orders:  Orders Placed This Encounter  Procedures  . XR Cervical Spine 2 or 3 views  . XR Shoulder Left   Meds ordered this encounter  Medications  . methylPREDNISolone (MEDROL DOSEPAK) 4 MG TBPK tablet    Sig: Take as directed.    Dispense:  21 tablet    Refill:  0      Procedures: No procedures performed   Clinical Data: No additional findings.   Subjective: Chief Complaint  Patient presents with  . Left Shoulder - Pain  Patient presents today for left shoulder pain and weakness. She said that it started after working in the yard 8 days ago. She is taking Aleve, Flexeril, and using heat. She has throbbing down to her fingers.  Not having any particular trouble raising her left arm over her head.  Patient will have some pain in her left arm with motion of her cervical spine.   HPI  Review of Systems   Objective: Vital Signs: Ht 5' 7.5" (1.715 m)   Wt 170 lb (77.1 kg)   BMI 26.23 kg/m   Physical Exam Constitutional:      Appearance: She is well-developed.  Eyes:     Pupils: Pupils are equal, round, and reactive to light.  Pulmonary:     Effort: Pulmonary effort is normal.  Skin:    General: Skin is warm and dry.  Neurological:     Mental Status: She is alert and oriented  to person, place, and time.  Psychiatric:        Behavior: Behavior normal.     Ortho Exam awake alert and oriented x3.  Comfortable sitting able to touch her chin to her chest with mild discomfort.  Had about 70% of normal neck extension with some referred pain to the left upper extremity including the forearm and the hand.  This resolved when placing her neck in a neutral position.  Very mild loss of rotation of the right and to the left without any referred pain.  No evidence of impingement with motion of the left shoulder.  Negative empty can testing.  Skin intact.  No localized areas of tenderness.  No loss of motion.  Reflexes symmetrical left upper extremity.  Good grip and release  Specialty Comments:  No specialty comments available.  Imaging: Xr Cervical Spine 2 Or 3 Views  Result Date: 12/30/2018 Films of the cervical spine were obtained in 2 projections.  There is no evidence of a spondylolisthesis.  There are degenerative changes in the mid cervical spine with decreased disc space height at 4 5 and C5-6 and also  minimally at C6-7.  No acute injuries.  No prevertebral edema.  Xr Shoulder Left  Result Date: 12/30/2018 Numbness of the left shoulder obtained in 2 projections.  No ectopic calcification or acute change.  Humeral head is centered about the glenoid.  Acromium is flat.  Degenerative changes at the chromic clavicular joint    PMFS History: Patient Active Problem List   Diagnosis Date Noted  . Cervicalgia 12/30/2018  . Acute pain of left shoulder 12/30/2018  . Generalized anxiety disorder 12/28/2015  . GERD (gastroesophageal reflux disease) 12/28/2015  . Essential hypertension 12/28/2015   Past Medical History:  Diagnosis Date  . Anxiety   . Hypertension   . Migraine     History reviewed. No pertinent family history.  Past Surgical History:  Procedure Laterality Date  . ANKLE FRACTURE SURGERY    . APPENDECTOMY    . CHOLECYSTECTOMY    . TONSILLECTOMY    .  TUBAL LIGATION     Social History   Occupational History  . Not on file  Tobacco Use  . Smoking status: Never Smoker  . Smokeless tobacco: Never Used  Substance and Sexual Activity  . Alcohol use: No    Alcohol/week: 0.0 standard drinks  . Drug use: No  . Sexual activity: Not on file

## 2019-01-01 DIAGNOSIS — K219 Gastro-esophageal reflux disease without esophagitis: Secondary | ICD-10-CM | POA: Diagnosis not present

## 2019-01-01 DIAGNOSIS — E871 Hypo-osmolality and hyponatremia: Secondary | ICD-10-CM | POA: Diagnosis not present

## 2019-01-01 DIAGNOSIS — R42 Dizziness and giddiness: Secondary | ICD-10-CM | POA: Diagnosis not present

## 2019-01-01 DIAGNOSIS — I10 Essential (primary) hypertension: Secondary | ICD-10-CM | POA: Diagnosis not present

## 2019-01-01 DIAGNOSIS — R945 Abnormal results of liver function studies: Secondary | ICD-10-CM | POA: Diagnosis not present

## 2019-01-06 DIAGNOSIS — I10 Essential (primary) hypertension: Secondary | ICD-10-CM | POA: Diagnosis not present

## 2019-01-06 DIAGNOSIS — E782 Mixed hyperlipidemia: Secondary | ICD-10-CM | POA: Diagnosis not present

## 2019-01-06 DIAGNOSIS — J301 Allergic rhinitis due to pollen: Secondary | ICD-10-CM | POA: Diagnosis not present

## 2019-04-06 DIAGNOSIS — K219 Gastro-esophageal reflux disease without esophagitis: Secondary | ICD-10-CM | POA: Diagnosis not present

## 2019-04-06 DIAGNOSIS — I10 Essential (primary) hypertension: Secondary | ICD-10-CM | POA: Diagnosis not present

## 2019-04-06 DIAGNOSIS — R42 Dizziness and giddiness: Secondary | ICD-10-CM | POA: Diagnosis not present

## 2019-04-06 DIAGNOSIS — R945 Abnormal results of liver function studies: Secondary | ICD-10-CM | POA: Diagnosis not present

## 2019-04-12 DIAGNOSIS — Z0001 Encounter for general adult medical examination with abnormal findings: Secondary | ICD-10-CM | POA: Diagnosis not present

## 2019-04-12 DIAGNOSIS — I10 Essential (primary) hypertension: Secondary | ICD-10-CM | POA: Diagnosis not present

## 2019-04-12 DIAGNOSIS — E782 Mixed hyperlipidemia: Secondary | ICD-10-CM | POA: Diagnosis not present

## 2019-05-03 ENCOUNTER — Other Ambulatory Visit: Payer: Self-pay

## 2019-05-03 NOTE — Patient Outreach (Signed)
[facility]) Care Management  05/03/2019  Amanda Bowman 25-Aug-1946 [id number]   Medication Adherence call to [name] HIPPA Compliant Voice message left with a call back number. [name] is showing past due on Lisinopril/Hctz 20 12.5 mg under [place].  [facility] Management Direct Dial [phone]  Fax [phone] [email or web address]

## 2019-05-18 ENCOUNTER — Encounter: Payer: Self-pay | Admitting: Orthopaedic Surgery

## 2019-05-18 ENCOUNTER — Other Ambulatory Visit: Payer: Self-pay

## 2019-05-18 ENCOUNTER — Ambulatory Visit (INDEPENDENT_AMBULATORY_CARE_PROVIDER_SITE_OTHER): Payer: Medicare Other | Admitting: Orthopaedic Surgery

## 2019-05-18 ENCOUNTER — Ambulatory Visit: Payer: Self-pay

## 2019-05-18 VITALS — BP 124/75 | HR 91 | Ht 67.0 in | Wt 192.0 lb

## 2019-05-18 DIAGNOSIS — M170 Bilateral primary osteoarthritis of knee: Secondary | ICD-10-CM | POA: Diagnosis not present

## 2019-05-18 DIAGNOSIS — M25561 Pain in right knee: Secondary | ICD-10-CM | POA: Diagnosis not present

## 2019-05-18 DIAGNOSIS — G8929 Other chronic pain: Secondary | ICD-10-CM | POA: Diagnosis not present

## 2019-05-18 DIAGNOSIS — M25562 Pain in left knee: Secondary | ICD-10-CM | POA: Diagnosis not present

## 2019-05-18 MED ORDER — BUPIVACAINE HCL 0.5 % IJ SOLN
2.0000 mL | INTRAMUSCULAR | Status: AC | PRN
  Administered 2019-05-18: 2 mL via INTRA_ARTICULAR

## 2019-05-18 MED ORDER — LIDOCAINE HCL 1 % IJ SOLN
2.0000 mL | INTRAMUSCULAR | Status: AC | PRN
  Administered 2019-05-18: 12:00:00 2 mL

## 2019-05-18 MED ORDER — METHYLPREDNISOLONE ACETATE 40 MG/ML IJ SUSP
80.0000 mg | INTRAMUSCULAR | Status: AC | PRN
  Administered 2019-05-18: 80 mg via INTRA_ARTICULAR

## 2019-05-18 MED ORDER — LIDOCAINE HCL 1 % IJ SOLN
2.0000 mL | INTRAMUSCULAR | Status: AC | PRN
  Administered 2019-05-18: 2 mL

## 2019-05-18 NOTE — Progress Notes (Signed)
Office Visit Note   Patient: Amanda Bowman           Date of Birth: 09-Jul-1946           MRN: [id number] Visit Date: 05/18/2019              Requested by: Caryl Bis, MD [place],  [place] [address] PCP: Caryl Bis, MD   Assessment & Plan: Visit Diagnoses:  1. Chronic pain of both knees   2. Bilateral primary osteoarthritis of knee     Plan: Moderate osteoarthritis bilateral knees.  Long discussion regarding diagnosis and treatment options.  We will proceed with cortisone injection to both knees medially and monitor response  Follow-Up Instructions: Return if symptoms worsen or fail to improve.   Orders:  Orders Placed This Encounter  Procedures  . Large Joint Inj: bilateral knee  . XR KNEE 3 VIEW LEFT  . XR KNEE 3 VIEW RIGHT   No orders of the defined types were placed in this encounter.     Procedures: Large Joint Inj: bilateral knee on 05/18/2019 11:49 AM Indications: diagnostic evaluation Details: 25 G 1.5 in needle, anteromedial approach  Arthrogram: No  Medications (Right): 2 mL bupivacaine 0.5 %; 80 mg methylPREDNISolone acetate 40 MG/ML; 2 mL lidocaine 1 % Medications (Left): 2 mL bupivacaine 0.5 %; 80 mg methylPREDNISolone acetate 40 MG/ML; 2 mL lidocaine 1 % Consent was given by the patient. Immediately prior to procedure a time out was called to verify the correct patient, procedure, equipment, support staff and site/side marked as required. Patient was prepped and draped in the usual sterile fashion.       Clinical Data: No additional findings.   Subjective: Chief Complaint  Patient presents with  . Right Knee - Pain  . Left Knee - Pain  . Left Hand - Pain  . Right Hand - Pain  Patient presents today for bilateral knee and hand pain. Patient states that her knees have been hurting for at least a year. The left knee hurts more than the right currently. She said that they both hurt all the time. She said that both feel weak when going  down stairs. She takes Aleve as needed.  No history of injury or trauma.  Occasionally will feel some swelling Her hands have been hurting for about 6 months. She said that her right middle and ring finger lock up. She is right hand dominant.   HPI  Review of Systems   Objective: Vital Signs: BP 124/75   Pulse 91   Ht 5\' 7"  (1.702 m)   Wt 192 lb (87.1 kg)   BMI 30.07 kg/m   Physical Exam Constitutional:      Appearance: She is well-developed.  Eyes:     Pupils: Pupils are equal, round, and reactive to light.  Pulmonary:     Effort: Pulmonary effort is normal.  Skin:    General: Skin is warm and dry.  Neurological:     Mental Status: She is alert and oriented to person, place, and time.  Psychiatric:        Behavior: Behavior normal.     Ortho Exam no obvious limp with weightbearing.  Comfortable sitting.  Feels a little bit stiff when she first gets up from a sitting position.  Predominant medial joint pain both knees.  No effusion.  No lateral joint pain.  Full extension about 105 degrees of flexion without instability.  No calf pain.  No popliteal  pain.  No distal edema.  Motor exam intact.  Painless range of motion both hips.  Straight leg raise negative Specialty Comments:  No specialty comments available.  Imaging: Xr Knee 3 View Left  Result Date: 05/18/2019 Films of the left knee obtained 3 projections standing.  Compared to the right knee the degenerative changes are not as severe.  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  There is narrowing of the medial joint space and minimal subchondral sclerosis.  There are some mild degenerative changes in the lateral compartment and more moderate changes in the patellofemoral compartment.  No acute changes.  Films are consistent with moderate osteoarthritis  Xr Knee 3 View Right  Result Date: 05/18/2019 Films of the right knee were obtained in 3 projections standing.  There is some  decrease in the medial joint space with very small peripheral osteophytes and minimal subchondral sclerosis.  There is about 1 degree of varus.  There might be some minimal calcification of the menisci consistent with CPPD.  Considerable degenerative change about the patella with large osteophytes laterally.  Films are consistent with moderate osteoarthritis    PMFS History: Patient Active Problem List   Diagnosis Date Noted  . Bilateral primary osteoarthritis of knee 05/18/2019  . Cervicalgia 12/30/2018  . Acute pain of left shoulder 12/30/2018  . Generalized anxiety disorder 12/28/2015  . GERD (gastroesophageal reflux disease) 12/28/2015  . Essential hypertension 12/28/2015   Past Medical History:  Diagnosis Date  . Anxiety   . Hypertension   . Migraine     History reviewed. No pertinent family history.  Past Surgical History:  Procedure Laterality Date  . ANKLE FRACTURE SURGERY    . APPENDECTOMY    . CHOLECYSTECTOMY    . TONSILLECTOMY    . TUBAL LIGATION     Social History   Occupational History  . Not on file  Tobacco Use  . Smoking status: Never Smoker  . Smokeless tobacco: Never Used  Substance and Sexual Activity  . Alcohol use: No    Alcohol/week: 0.0 standard drinks  . Drug use: No  . Sexual activity: Not on file

## 2019-06-14 DIAGNOSIS — J301 Allergic rhinitis due to pollen: Secondary | ICD-10-CM | POA: Diagnosis not present

## 2019-06-14 DIAGNOSIS — E782 Mixed hyperlipidemia: Secondary | ICD-10-CM | POA: Diagnosis not present

## 2019-06-14 DIAGNOSIS — I10 Essential (primary) hypertension: Secondary | ICD-10-CM | POA: Diagnosis not present

## 2019-06-14 DIAGNOSIS — Z23 Encounter for immunization: Secondary | ICD-10-CM | POA: Diagnosis not present

## 2019-06-21 DIAGNOSIS — D485 Neoplasm of uncertain behavior of skin: Secondary | ICD-10-CM | POA: Diagnosis not present

## 2019-06-21 DIAGNOSIS — D3617 Benign neoplasm of peripheral nerves and autonomic nervous system of trunk, unspecified: Secondary | ICD-10-CM | POA: Diagnosis not present

## 2019-06-28 DIAGNOSIS — Z1231 Encounter for screening mammogram for malignant neoplasm of breast: Secondary | ICD-10-CM | POA: Diagnosis not present

## 2019-08-10 ENCOUNTER — Other Ambulatory Visit: Payer: Self-pay

## 2019-08-10 NOTE — Patient Outreach (Signed)
[facility]) Care Management  08/10/2019  CLEMMA MANDALA 12/05/1945 [id number]   Medication Adherence call to Mrs. Denton Ar ,patient has a disconnected number ,patient is past due on Lisinopril/Hctz 20.12.5 mg under [place].   [facility] Management Direct Dial [phone]  Fax [phone] [email or web address]

## 2019-08-18 DIAGNOSIS — M545 Low back pain: Secondary | ICD-10-CM | POA: Diagnosis not present

## 2019-09-06 DIAGNOSIS — I10 Essential (primary) hypertension: Secondary | ICD-10-CM | POA: Diagnosis not present

## 2019-09-06 DIAGNOSIS — E782 Mixed hyperlipidemia: Secondary | ICD-10-CM | POA: Diagnosis not present

## 2019-09-06 DIAGNOSIS — K219 Gastro-esophageal reflux disease without esophagitis: Secondary | ICD-10-CM | POA: Diagnosis not present

## 2019-09-06 DIAGNOSIS — E871 Hypo-osmolality and hyponatremia: Secondary | ICD-10-CM | POA: Diagnosis not present

## 2019-09-14 DIAGNOSIS — Z0001 Encounter for general adult medical examination with abnormal findings: Secondary | ICD-10-CM | POA: Diagnosis not present

## 2019-09-14 DIAGNOSIS — E782 Mixed hyperlipidemia: Secondary | ICD-10-CM | POA: Diagnosis not present

## 2019-09-14 DIAGNOSIS — I10 Essential (primary) hypertension: Secondary | ICD-10-CM | POA: Diagnosis not present

## 2019-09-14 DIAGNOSIS — K219 Gastro-esophageal reflux disease without esophagitis: Secondary | ICD-10-CM | POA: Diagnosis not present

## 2019-09-14 DIAGNOSIS — R4582 Worries: Secondary | ICD-10-CM | POA: Diagnosis not present

## 2019-09-23 DIAGNOSIS — R42 Dizziness and giddiness: Secondary | ICD-10-CM | POA: Diagnosis not present

## 2019-09-23 DIAGNOSIS — M79671 Pain in right foot: Secondary | ICD-10-CM | POA: Diagnosis not present

## 2019-09-23 DIAGNOSIS — M25571 Pain in right ankle and joints of right foot: Secondary | ICD-10-CM | POA: Diagnosis not present

## 2019-09-25 DIAGNOSIS — M7989 Other specified soft tissue disorders: Secondary | ICD-10-CM | POA: Diagnosis not present

## 2019-09-25 DIAGNOSIS — Z79899 Other long term (current) drug therapy: Secondary | ICD-10-CM | POA: Diagnosis not present

## 2019-09-25 DIAGNOSIS — R197 Diarrhea, unspecified: Secondary | ICD-10-CM | POA: Diagnosis not present

## 2019-09-25 DIAGNOSIS — M25571 Pain in right ankle and joints of right foot: Secondary | ICD-10-CM | POA: Diagnosis not present

## 2019-09-25 DIAGNOSIS — G4700 Insomnia, unspecified: Secondary | ICD-10-CM | POA: Diagnosis not present

## 2019-09-25 DIAGNOSIS — I10 Essential (primary) hypertension: Secondary | ICD-10-CM | POA: Diagnosis not present

## 2019-09-25 DIAGNOSIS — S99921A Unspecified injury of right foot, initial encounter: Secondary | ICD-10-CM | POA: Diagnosis not present

## 2019-09-25 DIAGNOSIS — K219 Gastro-esophageal reflux disease without esophagitis: Secondary | ICD-10-CM | POA: Diagnosis not present

## 2019-09-25 DIAGNOSIS — Z885 Allergy status to narcotic agent status: Secondary | ICD-10-CM | POA: Diagnosis not present

## 2019-09-25 DIAGNOSIS — Z880 Allergy status to penicillin: Secondary | ICD-10-CM | POA: Diagnosis not present

## 2019-09-25 DIAGNOSIS — M79671 Pain in right foot: Secondary | ICD-10-CM | POA: Diagnosis not present

## 2019-09-25 DIAGNOSIS — E7800 Pure hypercholesterolemia, unspecified: Secondary | ICD-10-CM | POA: Diagnosis not present

## 2019-09-25 DIAGNOSIS — R1084 Generalized abdominal pain: Secondary | ICD-10-CM | POA: Diagnosis not present

## 2019-10-01 DIAGNOSIS — R531 Weakness: Secondary | ICD-10-CM | POA: Diagnosis not present

## 2019-10-01 DIAGNOSIS — A0472 Enterocolitis due to Clostridium difficile, not specified as recurrent: Secondary | ICD-10-CM | POA: Diagnosis not present

## 2019-10-01 DIAGNOSIS — S93401D Sprain of unspecified ligament of right ankle, subsequent encounter: Secondary | ICD-10-CM | POA: Diagnosis not present

## 2019-10-01 DIAGNOSIS — Z9181 History of falling: Secondary | ICD-10-CM | POA: Diagnosis not present

## 2019-10-06 DIAGNOSIS — S93401D Sprain of unspecified ligament of right ankle, subsequent encounter: Secondary | ICD-10-CM | POA: Diagnosis not present

## 2019-10-06 DIAGNOSIS — Z9181 History of falling: Secondary | ICD-10-CM | POA: Diagnosis not present

## 2019-10-06 DIAGNOSIS — R531 Weakness: Secondary | ICD-10-CM | POA: Diagnosis not present

## 2019-10-06 DIAGNOSIS — A0472 Enterocolitis due to Clostridium difficile, not specified as recurrent: Secondary | ICD-10-CM | POA: Diagnosis not present

## 2019-10-07 DIAGNOSIS — A0472 Enterocolitis due to Clostridium difficile, not specified as recurrent: Secondary | ICD-10-CM | POA: Diagnosis not present

## 2019-10-07 DIAGNOSIS — R531 Weakness: Secondary | ICD-10-CM | POA: Diagnosis not present

## 2019-10-07 DIAGNOSIS — Z9181 History of falling: Secondary | ICD-10-CM | POA: Diagnosis not present

## 2019-10-07 DIAGNOSIS — S93401D Sprain of unspecified ligament of right ankle, subsequent encounter: Secondary | ICD-10-CM | POA: Diagnosis not present

## 2019-10-11 DIAGNOSIS — S93401D Sprain of unspecified ligament of right ankle, subsequent encounter: Secondary | ICD-10-CM | POA: Diagnosis not present

## 2019-10-11 DIAGNOSIS — R531 Weakness: Secondary | ICD-10-CM | POA: Diagnosis not present

## 2019-10-11 DIAGNOSIS — A0472 Enterocolitis due to Clostridium difficile, not specified as recurrent: Secondary | ICD-10-CM | POA: Diagnosis not present

## 2019-10-11 DIAGNOSIS — Z9181 History of falling: Secondary | ICD-10-CM | POA: Diagnosis not present

## 2019-10-14 DIAGNOSIS — R531 Weakness: Secondary | ICD-10-CM | POA: Diagnosis not present

## 2019-10-14 DIAGNOSIS — Z9181 History of falling: Secondary | ICD-10-CM | POA: Diagnosis not present

## 2019-10-14 DIAGNOSIS — S93401D Sprain of unspecified ligament of right ankle, subsequent encounter: Secondary | ICD-10-CM | POA: Diagnosis not present

## 2019-10-14 DIAGNOSIS — A0472 Enterocolitis due to Clostridium difficile, not specified as recurrent: Secondary | ICD-10-CM | POA: Diagnosis not present

## 2019-10-18 DIAGNOSIS — S93401D Sprain of unspecified ligament of right ankle, subsequent encounter: Secondary | ICD-10-CM | POA: Diagnosis not present

## 2019-10-18 DIAGNOSIS — Z9181 History of falling: Secondary | ICD-10-CM | POA: Diagnosis not present

## 2019-10-18 DIAGNOSIS — R531 Weakness: Secondary | ICD-10-CM | POA: Diagnosis not present

## 2019-10-18 DIAGNOSIS — A0472 Enterocolitis due to Clostridium difficile, not specified as recurrent: Secondary | ICD-10-CM | POA: Diagnosis not present

## 2019-10-21 DIAGNOSIS — S93401D Sprain of unspecified ligament of right ankle, subsequent encounter: Secondary | ICD-10-CM | POA: Diagnosis not present

## 2019-10-21 DIAGNOSIS — Z9181 History of falling: Secondary | ICD-10-CM | POA: Diagnosis not present

## 2019-10-21 DIAGNOSIS — A0472 Enterocolitis due to Clostridium difficile, not specified as recurrent: Secondary | ICD-10-CM | POA: Diagnosis not present

## 2019-10-21 DIAGNOSIS — R531 Weakness: Secondary | ICD-10-CM | POA: Diagnosis not present

## 2019-10-25 DIAGNOSIS — A0472 Enterocolitis due to Clostridium difficile, not specified as recurrent: Secondary | ICD-10-CM | POA: Diagnosis not present

## 2019-10-25 DIAGNOSIS — R531 Weakness: Secondary | ICD-10-CM | POA: Diagnosis not present

## 2019-10-25 DIAGNOSIS — Z9181 History of falling: Secondary | ICD-10-CM | POA: Diagnosis not present

## 2019-10-25 DIAGNOSIS — S93401D Sprain of unspecified ligament of right ankle, subsequent encounter: Secondary | ICD-10-CM | POA: Diagnosis not present

## 2019-10-27 DIAGNOSIS — R531 Weakness: Secondary | ICD-10-CM | POA: Diagnosis not present

## 2019-10-27 DIAGNOSIS — A0472 Enterocolitis due to Clostridium difficile, not specified as recurrent: Secondary | ICD-10-CM | POA: Diagnosis not present

## 2019-10-27 DIAGNOSIS — S93401D Sprain of unspecified ligament of right ankle, subsequent encounter: Secondary | ICD-10-CM | POA: Diagnosis not present

## 2019-10-27 DIAGNOSIS — Z9181 History of falling: Secondary | ICD-10-CM | POA: Diagnosis not present

## 2019-11-01 DIAGNOSIS — S93401D Sprain of unspecified ligament of right ankle, subsequent encounter: Secondary | ICD-10-CM | POA: Diagnosis not present

## 2019-11-01 DIAGNOSIS — R531 Weakness: Secondary | ICD-10-CM | POA: Diagnosis not present

## 2019-11-01 DIAGNOSIS — Z9181 History of falling: Secondary | ICD-10-CM | POA: Diagnosis not present

## 2019-11-01 DIAGNOSIS — A0472 Enterocolitis due to Clostridium difficile, not specified as recurrent: Secondary | ICD-10-CM | POA: Diagnosis not present

## 2019-11-03 DIAGNOSIS — R531 Weakness: Secondary | ICD-10-CM | POA: Diagnosis not present

## 2019-11-03 DIAGNOSIS — Z9181 History of falling: Secondary | ICD-10-CM | POA: Diagnosis not present

## 2019-11-03 DIAGNOSIS — A0472 Enterocolitis due to Clostridium difficile, not specified as recurrent: Secondary | ICD-10-CM | POA: Diagnosis not present

## 2019-11-03 DIAGNOSIS — S93401D Sprain of unspecified ligament of right ankle, subsequent encounter: Secondary | ICD-10-CM | POA: Diagnosis not present

## 2019-11-09 DIAGNOSIS — Z9181 History of falling: Secondary | ICD-10-CM | POA: Diagnosis not present

## 2019-11-09 DIAGNOSIS — A0472 Enterocolitis due to Clostridium difficile, not specified as recurrent: Secondary | ICD-10-CM | POA: Diagnosis not present

## 2019-11-09 DIAGNOSIS — R531 Weakness: Secondary | ICD-10-CM | POA: Diagnosis not present

## 2019-11-09 DIAGNOSIS — S93401D Sprain of unspecified ligament of right ankle, subsequent encounter: Secondary | ICD-10-CM | POA: Diagnosis not present

## 2019-11-11 DIAGNOSIS — R531 Weakness: Secondary | ICD-10-CM | POA: Diagnosis not present

## 2019-11-11 DIAGNOSIS — S93401D Sprain of unspecified ligament of right ankle, subsequent encounter: Secondary | ICD-10-CM | POA: Diagnosis not present

## 2019-11-11 DIAGNOSIS — Z9181 History of falling: Secondary | ICD-10-CM | POA: Diagnosis not present

## 2019-11-11 DIAGNOSIS — A0472 Enterocolitis due to Clostridium difficile, not specified as recurrent: Secondary | ICD-10-CM | POA: Diagnosis not present

## 2019-11-16 DIAGNOSIS — A0472 Enterocolitis due to Clostridium difficile, not specified as recurrent: Secondary | ICD-10-CM | POA: Diagnosis not present

## 2019-11-16 DIAGNOSIS — Z9181 History of falling: Secondary | ICD-10-CM | POA: Diagnosis not present

## 2019-11-16 DIAGNOSIS — S93401D Sprain of unspecified ligament of right ankle, subsequent encounter: Secondary | ICD-10-CM | POA: Diagnosis not present

## 2019-11-16 DIAGNOSIS — R531 Weakness: Secondary | ICD-10-CM | POA: Diagnosis not present

## 2019-11-18 DIAGNOSIS — R531 Weakness: Secondary | ICD-10-CM | POA: Diagnosis not present

## 2019-11-18 DIAGNOSIS — Z9181 History of falling: Secondary | ICD-10-CM | POA: Diagnosis not present

## 2019-11-18 DIAGNOSIS — S93401D Sprain of unspecified ligament of right ankle, subsequent encounter: Secondary | ICD-10-CM | POA: Diagnosis not present

## 2019-11-18 DIAGNOSIS — A0472 Enterocolitis due to Clostridium difficile, not specified as recurrent: Secondary | ICD-10-CM | POA: Diagnosis not present

## 2019-11-22 DIAGNOSIS — A0472 Enterocolitis due to Clostridium difficile, not specified as recurrent: Secondary | ICD-10-CM | POA: Diagnosis not present

## 2019-11-22 DIAGNOSIS — Z9181 History of falling: Secondary | ICD-10-CM | POA: Diagnosis not present

## 2019-11-22 DIAGNOSIS — R531 Weakness: Secondary | ICD-10-CM | POA: Diagnosis not present

## 2019-11-22 DIAGNOSIS — S93401D Sprain of unspecified ligament of right ankle, subsequent encounter: Secondary | ICD-10-CM | POA: Diagnosis not present

## 2019-11-26 DIAGNOSIS — A0472 Enterocolitis due to Clostridium difficile, not specified as recurrent: Secondary | ICD-10-CM | POA: Diagnosis not present

## 2019-11-26 DIAGNOSIS — S93401D Sprain of unspecified ligament of right ankle, subsequent encounter: Secondary | ICD-10-CM | POA: Diagnosis not present

## 2019-11-26 DIAGNOSIS — Z9181 History of falling: Secondary | ICD-10-CM | POA: Diagnosis not present

## 2019-11-26 DIAGNOSIS — R531 Weakness: Secondary | ICD-10-CM | POA: Diagnosis not present

## 2019-11-29 DIAGNOSIS — I10 Essential (primary) hypertension: Secondary | ICD-10-CM | POA: Diagnosis not present

## 2019-11-29 DIAGNOSIS — E782 Mixed hyperlipidemia: Secondary | ICD-10-CM | POA: Diagnosis not present

## 2019-11-29 DIAGNOSIS — R946 Abnormal results of thyroid function studies: Secondary | ICD-10-CM | POA: Diagnosis not present

## 2019-11-29 DIAGNOSIS — R945 Abnormal results of liver function studies: Secondary | ICD-10-CM | POA: Diagnosis not present

## 2019-11-29 DIAGNOSIS — K219 Gastro-esophageal reflux disease without esophagitis: Secondary | ICD-10-CM | POA: Diagnosis not present

## 2019-12-01 DIAGNOSIS — R531 Weakness: Secondary | ICD-10-CM | POA: Diagnosis not present

## 2019-12-01 DIAGNOSIS — I10 Essential (primary) hypertension: Secondary | ICD-10-CM | POA: Diagnosis not present

## 2019-12-01 DIAGNOSIS — Z9181 History of falling: Secondary | ICD-10-CM | POA: Diagnosis not present

## 2019-12-01 DIAGNOSIS — R4582 Worries: Secondary | ICD-10-CM | POA: Diagnosis not present

## 2019-12-01 DIAGNOSIS — R42 Dizziness and giddiness: Secondary | ICD-10-CM | POA: Diagnosis not present

## 2019-12-01 DIAGNOSIS — K219 Gastro-esophageal reflux disease without esophagitis: Secondary | ICD-10-CM | POA: Diagnosis not present

## 2019-12-01 DIAGNOSIS — S93401D Sprain of unspecified ligament of right ankle, subsequent encounter: Secondary | ICD-10-CM | POA: Diagnosis not present

## 2019-12-01 DIAGNOSIS — E782 Mixed hyperlipidemia: Secondary | ICD-10-CM | POA: Diagnosis not present

## 2019-12-01 DIAGNOSIS — A0472 Enterocolitis due to Clostridium difficile, not specified as recurrent: Secondary | ICD-10-CM | POA: Diagnosis not present

## 2019-12-07 DIAGNOSIS — I10 Essential (primary) hypertension: Secondary | ICD-10-CM | POA: Diagnosis not present

## 2019-12-07 DIAGNOSIS — K219 Gastro-esophageal reflux disease without esophagitis: Secondary | ICD-10-CM | POA: Diagnosis not present

## 2019-12-07 DIAGNOSIS — E782 Mixed hyperlipidemia: Secondary | ICD-10-CM | POA: Diagnosis not present

## 2019-12-07 DIAGNOSIS — R946 Abnormal results of thyroid function studies: Secondary | ICD-10-CM | POA: Diagnosis not present

## 2019-12-08 DIAGNOSIS — S93401D Sprain of unspecified ligament of right ankle, subsequent encounter: Secondary | ICD-10-CM | POA: Diagnosis not present

## 2019-12-08 DIAGNOSIS — Z9181 History of falling: Secondary | ICD-10-CM | POA: Diagnosis not present

## 2019-12-08 DIAGNOSIS — A0472 Enterocolitis due to Clostridium difficile, not specified as recurrent: Secondary | ICD-10-CM | POA: Diagnosis not present

## 2019-12-08 DIAGNOSIS — R531 Weakness: Secondary | ICD-10-CM | POA: Diagnosis not present

## 2019-12-08 DIAGNOSIS — K219 Gastro-esophageal reflux disease without esophagitis: Secondary | ICD-10-CM | POA: Diagnosis not present

## 2019-12-13 DIAGNOSIS — A0472 Enterocolitis due to Clostridium difficile, not specified as recurrent: Secondary | ICD-10-CM | POA: Diagnosis not present

## 2019-12-13 DIAGNOSIS — Z9181 History of falling: Secondary | ICD-10-CM | POA: Diagnosis not present

## 2019-12-13 DIAGNOSIS — R531 Weakness: Secondary | ICD-10-CM | POA: Diagnosis not present

## 2019-12-13 DIAGNOSIS — K219 Gastro-esophageal reflux disease without esophagitis: Secondary | ICD-10-CM | POA: Diagnosis not present

## 2019-12-13 DIAGNOSIS — S93401D Sprain of unspecified ligament of right ankle, subsequent encounter: Secondary | ICD-10-CM | POA: Diagnosis not present

## 2019-12-20 DIAGNOSIS — A0472 Enterocolitis due to Clostridium difficile, not specified as recurrent: Secondary | ICD-10-CM | POA: Diagnosis not present

## 2019-12-20 DIAGNOSIS — R531 Weakness: Secondary | ICD-10-CM | POA: Diagnosis not present

## 2019-12-20 DIAGNOSIS — K219 Gastro-esophageal reflux disease without esophagitis: Secondary | ICD-10-CM | POA: Diagnosis not present

## 2019-12-20 DIAGNOSIS — S93401D Sprain of unspecified ligament of right ankle, subsequent encounter: Secondary | ICD-10-CM | POA: Diagnosis not present

## 2019-12-20 DIAGNOSIS — Z9181 History of falling: Secondary | ICD-10-CM | POA: Diagnosis not present

## 2019-12-27 DIAGNOSIS — S93401D Sprain of unspecified ligament of right ankle, subsequent encounter: Secondary | ICD-10-CM | POA: Diagnosis not present

## 2019-12-27 DIAGNOSIS — Z9181 History of falling: Secondary | ICD-10-CM | POA: Diagnosis not present

## 2019-12-27 DIAGNOSIS — A0472 Enterocolitis due to Clostridium difficile, not specified as recurrent: Secondary | ICD-10-CM | POA: Diagnosis not present

## 2019-12-27 DIAGNOSIS — R531 Weakness: Secondary | ICD-10-CM | POA: Diagnosis not present

## 2019-12-27 DIAGNOSIS — K219 Gastro-esophageal reflux disease without esophagitis: Secondary | ICD-10-CM | POA: Diagnosis not present

## 2020-03-03 DIAGNOSIS — E782 Mixed hyperlipidemia: Secondary | ICD-10-CM | POA: Diagnosis not present

## 2020-03-03 DIAGNOSIS — E871 Hypo-osmolality and hyponatremia: Secondary | ICD-10-CM | POA: Diagnosis not present

## 2020-03-03 DIAGNOSIS — I10 Essential (primary) hypertension: Secondary | ICD-10-CM | POA: Diagnosis not present

## 2020-03-03 DIAGNOSIS — K219 Gastro-esophageal reflux disease without esophagitis: Secondary | ICD-10-CM | POA: Diagnosis not present

## 2020-03-07 DIAGNOSIS — J301 Allergic rhinitis due to pollen: Secondary | ICD-10-CM | POA: Diagnosis not present

## 2020-03-07 DIAGNOSIS — R4582 Worries: Secondary | ICD-10-CM | POA: Diagnosis not present

## 2020-03-07 DIAGNOSIS — E782 Mixed hyperlipidemia: Secondary | ICD-10-CM | POA: Diagnosis not present

## 2020-03-07 DIAGNOSIS — I10 Essential (primary) hypertension: Secondary | ICD-10-CM | POA: Diagnosis not present

## 2020-06-02 DIAGNOSIS — E871 Hypo-osmolality and hyponatremia: Secondary | ICD-10-CM | POA: Diagnosis not present

## 2020-06-02 DIAGNOSIS — R946 Abnormal results of thyroid function studies: Secondary | ICD-10-CM | POA: Diagnosis not present

## 2020-06-02 DIAGNOSIS — I10 Essential (primary) hypertension: Secondary | ICD-10-CM | POA: Diagnosis not present

## 2020-06-02 DIAGNOSIS — K219 Gastro-esophageal reflux disease without esophagitis: Secondary | ICD-10-CM | POA: Diagnosis not present

## 2020-06-06 DIAGNOSIS — Z23 Encounter for immunization: Secondary | ICD-10-CM | POA: Diagnosis not present

## 2020-06-06 DIAGNOSIS — I10 Essential (primary) hypertension: Secondary | ICD-10-CM | POA: Diagnosis not present

## 2020-06-06 DIAGNOSIS — R4582 Worries: Secondary | ICD-10-CM | POA: Diagnosis not present

## 2020-06-06 DIAGNOSIS — E782 Mixed hyperlipidemia: Secondary | ICD-10-CM | POA: Diagnosis not present

## 2020-06-20 DIAGNOSIS — I951 Orthostatic hypotension: Secondary | ICD-10-CM | POA: Diagnosis not present

## 2020-06-20 DIAGNOSIS — R42 Dizziness and giddiness: Secondary | ICD-10-CM | POA: Diagnosis not present

## 2020-09-14 DIAGNOSIS — K219 Gastro-esophageal reflux disease without esophagitis: Secondary | ICD-10-CM | POA: Diagnosis not present

## 2020-09-14 DIAGNOSIS — E782 Mixed hyperlipidemia: Secondary | ICD-10-CM | POA: Diagnosis not present

## 2020-09-14 DIAGNOSIS — Z1159 Encounter for screening for other viral diseases: Secondary | ICD-10-CM | POA: Diagnosis not present

## 2020-09-14 DIAGNOSIS — I10 Essential (primary) hypertension: Secondary | ICD-10-CM | POA: Diagnosis not present

## 2020-09-14 DIAGNOSIS — Z0001 Encounter for general adult medical examination with abnormal findings: Secondary | ICD-10-CM | POA: Diagnosis not present

## 2020-09-18 DIAGNOSIS — Z0001 Encounter for general adult medical examination with abnormal findings: Secondary | ICD-10-CM | POA: Diagnosis not present

## 2020-09-18 DIAGNOSIS — R4582 Worries: Secondary | ICD-10-CM | POA: Diagnosis not present

## 2020-09-18 DIAGNOSIS — E782 Mixed hyperlipidemia: Secondary | ICD-10-CM | POA: Diagnosis not present

## 2020-09-18 DIAGNOSIS — I10 Essential (primary) hypertension: Secondary | ICD-10-CM | POA: Diagnosis not present

## 2020-09-18 DIAGNOSIS — J301 Allergic rhinitis due to pollen: Secondary | ICD-10-CM | POA: Diagnosis not present

## 2020-09-18 DIAGNOSIS — R079 Chest pain, unspecified: Secondary | ICD-10-CM | POA: Diagnosis not present

## 2020-09-27 DIAGNOSIS — R531 Weakness: Secondary | ICD-10-CM | POA: Diagnosis not present

## 2020-09-27 DIAGNOSIS — R0602 Shortness of breath: Secondary | ICD-10-CM | POA: Diagnosis not present

## 2020-09-27 DIAGNOSIS — K219 Gastro-esophageal reflux disease without esophagitis: Secondary | ICD-10-CM | POA: Diagnosis not present

## 2020-09-27 DIAGNOSIS — R079 Chest pain, unspecified: Secondary | ICD-10-CM | POA: Diagnosis not present

## 2020-10-13 DIAGNOSIS — R0602 Shortness of breath: Secondary | ICD-10-CM | POA: Diagnosis not present

## 2020-10-13 DIAGNOSIS — R079 Chest pain, unspecified: Secondary | ICD-10-CM | POA: Diagnosis not present

## 2020-11-28 DIAGNOSIS — R109 Unspecified abdominal pain: Secondary | ICD-10-CM | POA: Diagnosis not present

## 2020-11-28 DIAGNOSIS — R0602 Shortness of breath: Secondary | ICD-10-CM | POA: Diagnosis not present

## 2020-12-26 DIAGNOSIS — E7849 Other hyperlipidemia: Secondary | ICD-10-CM | POA: Diagnosis not present

## 2020-12-26 DIAGNOSIS — K219 Gastro-esophageal reflux disease without esophagitis: Secondary | ICD-10-CM | POA: Diagnosis not present

## 2020-12-26 DIAGNOSIS — E782 Mixed hyperlipidemia: Secondary | ICD-10-CM | POA: Diagnosis not present

## 2020-12-26 DIAGNOSIS — R945 Abnormal results of liver function studies: Secondary | ICD-10-CM | POA: Diagnosis not present

## 2020-12-27 DIAGNOSIS — E7849 Other hyperlipidemia: Secondary | ICD-10-CM | POA: Diagnosis not present

## 2020-12-27 DIAGNOSIS — R4582 Worries: Secondary | ICD-10-CM | POA: Diagnosis not present

## 2020-12-27 DIAGNOSIS — J301 Allergic rhinitis due to pollen: Secondary | ICD-10-CM | POA: Diagnosis not present

## 2020-12-27 DIAGNOSIS — I10 Essential (primary) hypertension: Secondary | ICD-10-CM | POA: Diagnosis not present

## 2021-01-24 DIAGNOSIS — Z23 Encounter for immunization: Secondary | ICD-10-CM | POA: Diagnosis not present

## 2021-01-24 DIAGNOSIS — E7849 Other hyperlipidemia: Secondary | ICD-10-CM | POA: Diagnosis not present

## 2021-01-24 DIAGNOSIS — R4582 Worries: Secondary | ICD-10-CM | POA: Diagnosis not present

## 2021-01-24 DIAGNOSIS — I10 Essential (primary) hypertension: Secondary | ICD-10-CM | POA: Diagnosis not present

## 2021-04-19 DIAGNOSIS — E782 Mixed hyperlipidemia: Secondary | ICD-10-CM | POA: Diagnosis not present

## 2021-04-19 DIAGNOSIS — R945 Abnormal results of liver function studies: Secondary | ICD-10-CM | POA: Diagnosis not present

## 2021-04-19 DIAGNOSIS — R946 Abnormal results of thyroid function studies: Secondary | ICD-10-CM | POA: Diagnosis not present

## 2021-04-19 DIAGNOSIS — E7849 Other hyperlipidemia: Secondary | ICD-10-CM | POA: Diagnosis not present

## 2021-04-24 DIAGNOSIS — E7849 Other hyperlipidemia: Secondary | ICD-10-CM | POA: Diagnosis not present

## 2021-04-24 DIAGNOSIS — I10 Essential (primary) hypertension: Secondary | ICD-10-CM | POA: Diagnosis not present

## 2021-04-24 DIAGNOSIS — J301 Allergic rhinitis due to pollen: Secondary | ICD-10-CM | POA: Diagnosis not present

## 2021-04-24 DIAGNOSIS — R4582 Worries: Secondary | ICD-10-CM | POA: Diagnosis not present

## 2021-04-26 DIAGNOSIS — M79675 Pain in left toe(s): Secondary | ICD-10-CM | POA: Diagnosis not present

## 2021-04-26 DIAGNOSIS — L600 Ingrowing nail: Secondary | ICD-10-CM | POA: Diagnosis not present

## 2021-04-26 DIAGNOSIS — M79672 Pain in left foot: Secondary | ICD-10-CM | POA: Diagnosis not present

## 2021-04-26 DIAGNOSIS — L609 Nail disorder, unspecified: Secondary | ICD-10-CM | POA: Diagnosis not present

## 2021-05-14 DIAGNOSIS — B351 Tinea unguium: Secondary | ICD-10-CM | POA: Diagnosis not present

## 2021-05-14 DIAGNOSIS — L03032 Cellulitis of left toe: Secondary | ICD-10-CM | POA: Diagnosis not present

## 2021-05-14 DIAGNOSIS — I739 Peripheral vascular disease, unspecified: Secondary | ICD-10-CM | POA: Diagnosis not present

## 2021-05-14 DIAGNOSIS — M79675 Pain in left toe(s): Secondary | ICD-10-CM | POA: Diagnosis not present

## 2021-05-14 DIAGNOSIS — M79672 Pain in left foot: Secondary | ICD-10-CM | POA: Diagnosis not present

## 2021-05-28 DIAGNOSIS — L600 Ingrowing nail: Secondary | ICD-10-CM | POA: Diagnosis not present

## 2021-05-28 DIAGNOSIS — M79672 Pain in left foot: Secondary | ICD-10-CM | POA: Diagnosis not present

## 2021-05-28 DIAGNOSIS — M79675 Pain in left toe(s): Secondary | ICD-10-CM | POA: Diagnosis not present

## 2021-05-28 DIAGNOSIS — L03032 Cellulitis of left toe: Secondary | ICD-10-CM | POA: Diagnosis not present

## 2021-07-04 ENCOUNTER — Ambulatory Visit (INDEPENDENT_AMBULATORY_CARE_PROVIDER_SITE_OTHER): Payer: Medicare Other | Admitting: Orthopaedic Surgery

## 2021-07-04 ENCOUNTER — Ambulatory Visit: Payer: Self-pay

## 2021-07-04 ENCOUNTER — Other Ambulatory Visit: Payer: Self-pay

## 2021-07-04 ENCOUNTER — Encounter: Payer: Self-pay | Admitting: Orthopaedic Surgery

## 2021-07-04 DIAGNOSIS — Z23 Encounter for immunization: Secondary | ICD-10-CM | POA: Diagnosis not present

## 2021-07-04 DIAGNOSIS — M545 Low back pain, unspecified: Secondary | ICD-10-CM

## 2021-07-04 DIAGNOSIS — G8929 Other chronic pain: Secondary | ICD-10-CM | POA: Diagnosis not present

## 2021-07-04 NOTE — Progress Notes (Signed)
Office Visit Note   Patient: Amanda Bowman           Date of Birth: [birth year]           MRN: [id number] Visit Date: 07/04/2021              Requested by: Caryl Bis, MD [place],  [place] [address] PCP: Caryl Bis, MD   Assessment & Plan: Visit Diagnoses: No diagnosis found.  Plan: Patient with history of arthritis lumbar spine.  She was injected in 2018 by Dr. Ernestina Patches.  She does not think she had a MRI at that time.  She has also tried physical therapy in the past.  She has tried anti-inflammatories and Tylenol arthritis.  5 years ago she did have good luck with epidural steroid injections however she has not had any studies.  We will go forward with an MRI follow-up once it is complete  Follow-Up Instructions: No follow-ups on file.   Orders:  No orders of the defined types were placed in this encounter.  No orders of the defined types were placed in this encounter.     Procedures: No procedures performed   Clinical Data: No additional findings.   Subjective: Chief Complaint  Patient presents with   Lower Back - Pain  Patient presents today for lower back pain. She said that it has been hurting for over a month. No known injury. She said that the pain stays localized in her back. No numbness or tingling. She has more pain with increased activity. She is taking over the counter medicine and Flexeril. No previous back surgery.  She did have epidural steroid injections with Dr. Ernestina Patches in 2018 and these were somewhat helpful she denies any loss of bowel or bladder control.  She denies any paresthesias.  Pain is in her lower back and does radiate into her left buttock    Review of Systems  All other systems reviewed and are negative.   Objective: Vital Signs: There were no vitals taken for this visit.  Physical Exam Constitutional:      Appearance: Normal appearance.  Eyes:     Extraocular Movements: Extraocular movements intact.  Pulmonary:      Effort: Pulmonary effort is normal.     Breath sounds: Normal breath sounds.  Neurological:     General: No focal deficit present.     Mental Status: She is alert.  Psychiatric:        Mood and Affect: Mood normal.        Behavior: Behavior normal.    Ortho Exam Examination of her lumbar spine no step-offs to palpation no clinical abnormalities.  She does have some tenderness in the lower back that recreates pain into the buttock on the left.  Does not progress down her leg.  She has 5 out of 5 strength with dorsiflexion plantarflexion and resistance against extension.  She has a positive straight leg on the left mildly positive on the right pain is not reproduced with forward flexion or extension Specialty Comments:  No specialty comments available.  Imaging: No results found.   PMFS History: Patient Active Problem List   Diagnosis Date Noted   Bilateral primary osteoarthritis of knee 05/18/2019   Cervicalgia 12/30/2018   Acute pain of left shoulder 12/30/2018   Generalized anxiety disorder 12/28/2015   GERD (gastroesophageal reflux disease) 12/28/2015   Essential hypertension 12/28/2015   Past Medical History:  Diagnosis Date   Anxiety  Hypertension    Migraine     No family history on file.  Past Surgical History:  Procedure Laterality Date   ANKLE FRACTURE SURGERY     APPENDECTOMY     CHOLECYSTECTOMY     TONSILLECTOMY     TUBAL LIGATION     Social History   Occupational History   Not on file  Tobacco Use   Smoking status: Never   Smokeless tobacco: Never  Substance and Sexual Activity   Alcohol use: No    Alcohol/week: 0.0 standard drinks   Drug use: No   Sexual activity: Not on file

## 2021-07-18 DIAGNOSIS — L299 Pruritus, unspecified: Secondary | ICD-10-CM | POA: Diagnosis not present

## 2021-07-19 ENCOUNTER — Ambulatory Visit (HOSPITAL_COMMUNITY)
Admission: RE | Admit: 2021-07-19 | Discharge: 2021-07-19 | Disposition: A | Discharge status: Home / Self Care | Payer: Medicare Other | Source: Ambulatory Visit | Attending: Orthopaedic Surgery | Admitting: Orthopaedic Surgery

## 2021-07-19 ENCOUNTER — Other Ambulatory Visit: Payer: Self-pay

## 2021-07-19 DIAGNOSIS — M545 Low back pain, unspecified: Secondary | ICD-10-CM | POA: Insufficient documentation

## 2021-07-19 DIAGNOSIS — G8929 Other chronic pain: Secondary | ICD-10-CM | POA: Insufficient documentation

## 2021-07-19 DIAGNOSIS — M47816 Spondylosis without myelopathy or radiculopathy, lumbar region: Secondary | ICD-10-CM | POA: Diagnosis not present

## 2021-07-23 DIAGNOSIS — M79674 Pain in right toe(s): Secondary | ICD-10-CM | POA: Diagnosis not present

## 2021-07-23 DIAGNOSIS — L110 Acquired keratosis follicularis: Secondary | ICD-10-CM | POA: Diagnosis not present

## 2021-07-23 DIAGNOSIS — I739 Peripheral vascular disease, unspecified: Secondary | ICD-10-CM | POA: Diagnosis not present

## 2021-07-23 DIAGNOSIS — M79675 Pain in left toe(s): Secondary | ICD-10-CM | POA: Diagnosis not present

## 2021-07-23 DIAGNOSIS — M79672 Pain in left foot: Secondary | ICD-10-CM | POA: Diagnosis not present

## 2021-07-23 DIAGNOSIS — M79671 Pain in right foot: Secondary | ICD-10-CM | POA: Diagnosis not present

## 2021-08-30 ENCOUNTER — Encounter: Payer: Self-pay | Admitting: Orthopaedic Surgery

## 2021-09-17 DIAGNOSIS — K219 Gastro-esophageal reflux disease without esophagitis: Secondary | ICD-10-CM | POA: Diagnosis not present

## 2021-09-17 DIAGNOSIS — I10 Essential (primary) hypertension: Secondary | ICD-10-CM | POA: Diagnosis not present

## 2021-09-17 DIAGNOSIS — R945 Abnormal results of liver function studies: Secondary | ICD-10-CM | POA: Diagnosis not present

## 2021-09-17 DIAGNOSIS — E7849 Other hyperlipidemia: Secondary | ICD-10-CM | POA: Diagnosis not present

## 2021-09-17 DIAGNOSIS — Z1329 Encounter for screening for other suspected endocrine disorder: Secondary | ICD-10-CM | POA: Diagnosis not present

## 2021-09-18 DIAGNOSIS — Z131 Encounter for screening for diabetes mellitus: Secondary | ICD-10-CM | POA: Diagnosis not present

## 2021-09-18 DIAGNOSIS — I10 Essential (primary) hypertension: Secondary | ICD-10-CM | POA: Diagnosis not present

## 2021-09-18 DIAGNOSIS — E782 Mixed hyperlipidemia: Secondary | ICD-10-CM | POA: Diagnosis not present

## 2021-09-18 DIAGNOSIS — Z1329 Encounter for screening for other suspected endocrine disorder: Secondary | ICD-10-CM | POA: Diagnosis not present

## 2021-09-18 DIAGNOSIS — K219 Gastro-esophageal reflux disease without esophagitis: Secondary | ICD-10-CM | POA: Diagnosis not present

## 2021-09-18 DIAGNOSIS — R945 Abnormal results of liver function studies: Secondary | ICD-10-CM | POA: Diagnosis not present

## 2021-09-20 DIAGNOSIS — E7849 Other hyperlipidemia: Secondary | ICD-10-CM | POA: Diagnosis not present

## 2021-09-20 DIAGNOSIS — R4582 Worries: Secondary | ICD-10-CM | POA: Diagnosis not present

## 2021-09-20 DIAGNOSIS — J301 Allergic rhinitis due to pollen: Secondary | ICD-10-CM | POA: Diagnosis not present

## 2021-09-20 DIAGNOSIS — Z0001 Encounter for general adult medical examination with abnormal findings: Secondary | ICD-10-CM | POA: Diagnosis not present

## 2021-09-20 DIAGNOSIS — I10 Essential (primary) hypertension: Secondary | ICD-10-CM | POA: Diagnosis not present

## 2021-09-20 DIAGNOSIS — Z23 Encounter for immunization: Secondary | ICD-10-CM | POA: Diagnosis not present

## 2021-10-01 DIAGNOSIS — M79674 Pain in right toe(s): Secondary | ICD-10-CM | POA: Diagnosis not present

## 2021-10-01 DIAGNOSIS — L110 Acquired keratosis follicularis: Secondary | ICD-10-CM | POA: Diagnosis not present

## 2021-10-01 DIAGNOSIS — M79672 Pain in left foot: Secondary | ICD-10-CM | POA: Diagnosis not present

## 2021-10-01 DIAGNOSIS — M79675 Pain in left toe(s): Secondary | ICD-10-CM | POA: Diagnosis not present

## 2021-10-01 DIAGNOSIS — M79671 Pain in right foot: Secondary | ICD-10-CM | POA: Diagnosis not present

## 2021-10-01 DIAGNOSIS — I739 Peripheral vascular disease, unspecified: Secondary | ICD-10-CM | POA: Diagnosis not present

## 2021-10-11 DIAGNOSIS — R300 Dysuria: Secondary | ICD-10-CM | POA: Diagnosis not present

## 2021-10-11 DIAGNOSIS — R109 Unspecified abdominal pain: Secondary | ICD-10-CM | POA: Diagnosis not present

## 2021-10-11 DIAGNOSIS — I10 Essential (primary) hypertension: Secondary | ICD-10-CM | POA: Diagnosis not present

## 2021-11-01 DIAGNOSIS — I10 Essential (primary) hypertension: Secondary | ICD-10-CM | POA: Diagnosis not present

## 2021-11-26 DIAGNOSIS — H40053 Ocular hypertension, bilateral: Secondary | ICD-10-CM | POA: Diagnosis not present

## 2021-11-26 DIAGNOSIS — H524 Presbyopia: Secondary | ICD-10-CM | POA: Diagnosis not present

## 2021-12-11 DIAGNOSIS — M79675 Pain in left toe(s): Secondary | ICD-10-CM | POA: Diagnosis not present

## 2021-12-11 DIAGNOSIS — I739 Peripheral vascular disease, unspecified: Secondary | ICD-10-CM | POA: Diagnosis not present

## 2021-12-11 DIAGNOSIS — M79674 Pain in right toe(s): Secondary | ICD-10-CM | POA: Diagnosis not present

## 2021-12-11 DIAGNOSIS — M79671 Pain in right foot: Secondary | ICD-10-CM | POA: Diagnosis not present

## 2021-12-11 DIAGNOSIS — M79672 Pain in left foot: Secondary | ICD-10-CM | POA: Diagnosis not present

## 2021-12-11 DIAGNOSIS — L110 Acquired keratosis follicularis: Secondary | ICD-10-CM | POA: Diagnosis not present

## 2021-12-24 DIAGNOSIS — K219 Gastro-esophageal reflux disease without esophagitis: Secondary | ICD-10-CM | POA: Diagnosis not present

## 2021-12-24 DIAGNOSIS — I10 Essential (primary) hypertension: Secondary | ICD-10-CM | POA: Diagnosis not present

## 2021-12-24 DIAGNOSIS — Z1329 Encounter for screening for other suspected endocrine disorder: Secondary | ICD-10-CM | POA: Diagnosis not present

## 2021-12-24 DIAGNOSIS — M8589 Other specified disorders of bone density and structure, multiple sites: Secondary | ICD-10-CM | POA: Diagnosis not present

## 2021-12-24 DIAGNOSIS — E7849 Other hyperlipidemia: Secondary | ICD-10-CM | POA: Diagnosis not present

## 2021-12-24 DIAGNOSIS — E782 Mixed hyperlipidemia: Secondary | ICD-10-CM | POA: Diagnosis not present

## 2021-12-24 DIAGNOSIS — M810 Age-related osteoporosis without current pathological fracture: Secondary | ICD-10-CM | POA: Diagnosis not present

## 2021-12-24 DIAGNOSIS — Z13820 Encounter for screening for osteoporosis: Secondary | ICD-10-CM | POA: Diagnosis not present

## 2021-12-28 DIAGNOSIS — I10 Essential (primary) hypertension: Secondary | ICD-10-CM | POA: Diagnosis not present

## 2021-12-28 DIAGNOSIS — J301 Allergic rhinitis due to pollen: Secondary | ICD-10-CM | POA: Diagnosis not present

## 2021-12-28 DIAGNOSIS — E7849 Other hyperlipidemia: Secondary | ICD-10-CM | POA: Diagnosis not present

## 2021-12-28 DIAGNOSIS — R4582 Worries: Secondary | ICD-10-CM | POA: Diagnosis not present

## 2022-01-19 ENCOUNTER — Emergency Department (HOSPITAL_COMMUNITY)
Admission: EM | Admit: 2022-01-19 | Discharge: 2022-01-19 | Disposition: A | Discharge status: Home / Self Care | Payer: Medicare Other | Attending: Emergency Medicine | Admitting: Emergency Medicine

## 2022-01-19 ENCOUNTER — Emergency Department (HOSPITAL_COMMUNITY): Payer: Medicare Other

## 2022-01-19 ENCOUNTER — Other Ambulatory Visit: Payer: Self-pay

## 2022-01-19 ENCOUNTER — Encounter (HOSPITAL_COMMUNITY): Payer: Self-pay

## 2022-01-19 DIAGNOSIS — I10 Essential (primary) hypertension: Secondary | ICD-10-CM | POA: Insufficient documentation

## 2022-01-19 DIAGNOSIS — S50312A Abrasion of left elbow, initial encounter: Secondary | ICD-10-CM | POA: Insufficient documentation

## 2022-01-19 DIAGNOSIS — S42291A Other displaced fracture of upper end of right humerus, initial encounter for closed fracture: Secondary | ICD-10-CM | POA: Diagnosis not present

## 2022-01-19 DIAGNOSIS — S4991XA Unspecified injury of right shoulder and upper arm, initial encounter: Secondary | ICD-10-CM | POA: Diagnosis not present

## 2022-01-19 DIAGNOSIS — Z7982 Long term (current) use of aspirin: Secondary | ICD-10-CM | POA: Diagnosis not present

## 2022-01-19 DIAGNOSIS — M79629 Pain in unspecified upper arm: Secondary | ICD-10-CM | POA: Diagnosis not present

## 2022-01-19 DIAGNOSIS — Z743 Need for continuous supervision: Secondary | ICD-10-CM | POA: Diagnosis not present

## 2022-01-19 DIAGNOSIS — S42202A Unspecified fracture of upper end of left humerus, initial encounter for closed fracture: Secondary | ICD-10-CM | POA: Diagnosis not present

## 2022-01-19 DIAGNOSIS — Z79899 Other long term (current) drug therapy: Secondary | ICD-10-CM | POA: Diagnosis not present

## 2022-01-19 DIAGNOSIS — W010XXA Fall on same level from slipping, tripping and stumbling without subsequent striking against object, initial encounter: Secondary | ICD-10-CM | POA: Diagnosis not present

## 2022-01-19 DIAGNOSIS — M79621 Pain in right upper arm: Secondary | ICD-10-CM | POA: Diagnosis not present

## 2022-01-19 DIAGNOSIS — M79603 Pain in arm, unspecified: Secondary | ICD-10-CM | POA: Diagnosis not present

## 2022-01-19 MED ORDER — OXYCODONE-ACETAMINOPHEN 5-325 MG PO TABS
1.0000 | ORAL_TABLET | Freq: Once | ORAL | Status: AC
  Administered 2022-01-19: 1 via ORAL
  Filled 2022-01-19: qty 1

## 2022-01-19 MED ORDER — OXYCODONE-ACETAMINOPHEN 5-325 MG PO TABS: 1.0000 | ORAL_TABLET | ORAL | 0 refills | Status: DC | PRN

## 2022-01-19 MED ORDER — OXYCODONE-ACETAMINOPHEN 5-325 MG PO TABS: 1.0000 | ORAL_TABLET | Freq: Four times a day (QID) | ORAL | 0 refills | Status: DC | PRN

## 2022-01-19 MED ORDER — TETANUS-DIPHTH-ACELL PERTUSSIS 5-2.5-18.5 LF-MCG/0.5 IM SUSY
0.5000 mL | PREFILLED_SYRINGE | Freq: Once | INTRAMUSCULAR | Status: DC
  Filled 2022-01-19: qty 0.5

## 2022-01-19 NOTE — ED Notes (Signed)
Patient unable to sign discharge form due to faulty equipment. Patient gave verbal consent.  ?

## 2022-01-19 NOTE — Discharge Instructions (Signed)
Keep your right arm in the sling.  You may elevate your arm on a pillow at bedtime.  You may apply ice packs on and off to your shoulder.  Call your orthopedic provider on Monday to arrange a follow-up appointment.  Keep the abrasion to your left elbow clean and bandaged until the area heals.  You may clean with mild soap and water. ?

## 2022-01-19 NOTE — ED Triage Notes (Signed)
[facility]ms from home. Cc of right upper arm pain after a fall. Tripped over her feet.  No loc or head injury  ?

## 2022-01-21 ENCOUNTER — Other Ambulatory Visit: Payer: Self-pay | Admitting: Orthopedic Surgery

## 2022-01-21 ENCOUNTER — Ambulatory Visit
Admission: RE | Admit: 2022-01-21 | Discharge: 2022-01-21 | Disposition: A | Discharge status: Home / Self Care | Payer: Medicare Other | Source: Ambulatory Visit | Attending: Orthopedic Surgery | Admitting: Orthopedic Surgery

## 2022-01-21 DIAGNOSIS — S42211A Unspecified displaced fracture of surgical neck of right humerus, initial encounter for closed fracture: Secondary | ICD-10-CM | POA: Diagnosis not present

## 2022-01-21 DIAGNOSIS — M7989 Other specified soft tissue disorders: Secondary | ICD-10-CM | POA: Diagnosis not present

## 2022-01-21 DIAGNOSIS — S4291XA Fracture of right shoulder girdle, part unspecified, initial encounter for closed fracture: Secondary | ICD-10-CM

## 2022-01-21 DIAGNOSIS — S42254A Nondisplaced fracture of greater tuberosity of right humerus, initial encounter for closed fracture: Secondary | ICD-10-CM | POA: Diagnosis not present

## 2022-01-21 DIAGNOSIS — S42291A Other displaced fracture of upper end of right humerus, initial encounter for closed fracture: Secondary | ICD-10-CM | POA: Diagnosis not present

## 2022-01-21 MED FILL — Oxycodone w/ Acetaminophen Tab 5-325 MG: ORAL | Qty: 6 | Status: AC

## 2022-01-22 NOTE — ED Provider Notes (Signed)
?Amanda Bowman ?Provider Note ? ? ?CSN: [id number] ?Arrival date & time: 01/19/22  2125 ? ?  ? ?History ? ?Chief Complaint  ?Patient presents with  ? Fall  ? ? ?Amanda Bowman is a 76 y.o. female. ? ? ?Fall ?Pertinent negatives include no chest pain, no abdominal pain, no headaches and no shortness of breath.  ? ?  ? ?Amanda Bowman is a 76 y.o. female with past medical history of hypertension, migraine headaches, and anxiety who presents to the Emergency Department complaining of right shoulder and upper arm pain secondary to a mechanical fall.  She states she tripped and fell landing on her right shoulder area.  Reports pain to her right upper arm associated with any type of movement.  She denies any numbness of her arm, neck pain, head injury, LOC.  Shoulder pain improves when arm is at rest and held near her chest. ? ?Home Medications ?Prior to Admission medications   ?Medication Sig Start Date End Date Taking? Authorizing Provider  ?oxyCODONE-acetaminophen (PERCOCET/ROXICET) 5-325 MG tablet Take 1 tablet by mouth every 6 (six) hours as needed for severe pain. 01/19/22  Yes Laterria Lasota, PA-C  ?oxyCODONE-acetaminophen (PERCOCET/ROXICET) 5-325 MG tablet Take 1 tablet by mouth every 4 (four) hours as needed. 01/19/22  Yes Darshana Curnutt, PA-C  ?ALPRAZolam (XANAX) 0.5 MG tablet Take 0.5 mg by mouth at bedtime as needed for anxiety.    [provider]  ?aspirin 81 MG tablet Take 81 mg by mouth daily.    [provider]  ?citalopram (CELEXA) 20 MG tablet Take 20 mg by mouth daily.    [provider]  ?cyclobenzaprine (FLEXERIL) 10 MG tablet Take 10 mg by mouth 3 (three) times daily as needed for muscle spasms.    [provider]  ?dicyclomine (BENTYL) 10 MG capsule Take 1 capsule (10 mg total) by mouth 2 (two) times daily. 12/28/15   Setzer, Rona Ravens, NP  ?levothyroxine (SYNTHROID, LEVOTHROID) 25 MCG tablet Take 25 mcg by mouth daily before breakfast.     [provider]  ?lisinopril-hydrochlorothiazide (PRINZIDE,ZESTORETIC) 20-12.5 MG tablet Take 1 tablet by mouth daily.    [provider]  ?meclizine (ANTIVERT) 25 MG tablet Take 25 mg by mouth 3 (three) times daily as needed for dizziness.    [provider]  ?methylPREDNISolone (MEDROL DOSEPAK) 4 MG TBPK tablet Take as directed. 12/30/18   Garald Balding, MD  ?Multiple Vitamins-Minerals (WOMENS MULTI VITAMIN & MINERAL) TABS Take by mouth.    [provider]  ?omeprazole (PRILOSEC) 20 MG capsule Take 20 mg by mouth 2 (two) times daily before a meal.    [provider]  ?polyethylene glycol-electrolytes (NULYTELY/GOLYTELY) 420 g solution Take 4,000 mLs by mouth once. 12/28/15   Setzer, Rona Ravens, NP  ?promethazine (PHENERGAN) 25 MG tablet Take 25 mg by mouth every 6 (six) hours as needed for nausea or vomiting.    [provider]  ?topiramate (TOPAMAX) 25 MG capsule Take 25 mg by mouth 2 (two) times daily.    [provider]  ?traZODone (DESYREL) 50 MG tablet Take 200 mg by mouth.    [provider]  ?venlafaxine XR (EFFEXOR-XR) 150 MG 24 hr capsule Take 150 mg by mouth daily with breakfast.    [provider]  ?   ? ?Allergies    ?Codeine and Sulfa antibiotics   ? ?Review of Systems   ?Review of Systems  ?Eyes:  Negative for visual disturbance.  ?Respiratory:  Negative for shortness of breath.   ?Cardiovascular:  Negative for chest pain.  ?Gastrointestinal:  Negative for abdominal pain, nausea and vomiting.  ?Musculoskeletal:  Positive for arthralgias (Right shoulder and upper arm pain). Negative for neck pain.  ?Neurological:  Negative for dizziness, syncope, weakness, numbness and headaches.  ? ?Physical Exam ?Updated Vital Signs ?BP (!) 116/98 (BP Location: Left Arm)   Pulse 96   Temp 98.4 ?F (36.9 ?C)   Resp 18   Ht '5\' 7"'$  (1.702 m)   Wt 88 kg   SpO2 96%   BMI 30.39 kg/m?  ?Physical Exam ?Vitals and nursing note reviewed.   ?Constitutional:   ?   General: She is not in acute distress. ?   Appearance: Normal appearance. She is not toxic-appearing.  ?   Comments: Patient is uncomfortable appearing.  ?Eyes:  ?   Extraocular Movements: Extraocular movements intact.  ?   Pupils: Pupils are equal, round, and reactive to light.  ?Cardiovascular:  ?   Rate and Rhythm: Normal rate and regular rhythm.  ?   Pulses: Normal pulses.  ?Pulmonary:  ?   Effort: Pulmonary effort is normal.  ?Chest:  ?   Chest wall: No tenderness.  ?Musculoskeletal:     ?   General: Tenderness and signs of injury present. No swelling or deformity.  ?   Cervical back: Normal range of motion. No tenderness.  ?   Comments: Diffuse tenderness along the right shoulder joint and right upper arm.  There is no bony deformity or edema noted.  Right elbow and wrist are nontender.  Pain is reproduced with attempted abduction or right arm extension  ?Skin: ?   General: Skin is warm.  ?   Findings: No bruising or erythema.  ?Neurological:  ?   General: No focal deficit present.  ?   Mental Status: She is alert.  ?   Sensory: No sensory deficit.  ?   Motor: No weakness.  ? ? ?ED Results / Procedures / Treatments   ?Labs ?(all labs ordered are listed, but only abnormal results are displayed) ?Labs Reviewed - No data to display ? ?EKG ?None ? ?Radiology ?Humerus Right ? ?Result Date: 01/19/2022 ?CLINICAL DATA:  Fall EXAM: RIGHT HUMERUS - 2+ VIEW COMPARISON:  None Available. FINDINGS: Comminuted, mildly placed fracture of the proximal humerus, including the surgical neck. A fracture of the inferior rim of the glenoid is also suspected. IMPRESSION: Comminuted fracture of the proximal humerus. Suspect fracture of the inferior glenoid. Electronically Signed   By: Ulyses Jarred M.D.   On: 01/19/2022 21:56   ? ?Procedures ?Procedures  ? ? ?Medications Ordered in ED ?Medications  ?oxyCODONE-acetaminophen (PERCOCET/ROXICET) 5-325 MG per tablet 1 tablet (1 tablet Oral Given 01/19/22 2334)  ? ? ?ED  Course/ Medical Decision Making/ A&P ?  ?                        ?Medical Decision Making ?Amount and/or Complexity of Data Reviewed ?Radiology: ordered. ? ?Risk ?Prescription drug management. ? ? ?Patient here for evaluation of shoulder and right upper arm pain secondary to mechanical fall that occurred shortly before arrival.  Patient has pain with any attempted movement of the right arm.  Neurovascularly intact.  There is no chest or neck tenderness on exam.  No reported head injury or LOC.  Patient does not take blood thinners. ? ?X-ray of the right humerus shows comminuted fracture of the proximal humerus and suspected fracture  of the inferior glenoid. ? ?X-ray findings were discussed with the patient and family member at bedside.  Patient prefers orthopedic follow-up with Dr. Rudene Anda office.  She will be placed in a sling.  Short course of pain medication prescribed.  I feel that she is appropriate for discharge home, vital signs reviewed. ? ? ? ? ? ? ? ?Final Clinical Impression(s) / ED Diagnoses ?Final diagnoses:  ?Other closed displaced fracture of proximal end of right humerus, initial encounter  ?Abrasion of left elbow, initial encounter  ? ? ?Rx / DC Orders ?ED Discharge Orders   ? ?      Ordered  ?  oxyCODONE-acetaminophen (PERCOCET/ROXICET) 5-325 MG tablet  Every 6 hours PRN       ? 01/19/22 2334  ?  oxyCODONE-acetaminophen (PERCOCET/ROXICET) 5-325 MG tablet  Every 4 hours PRN       ? 01/19/22 2336  ? ?  ?  ? ?  ? ? ?  ?Kem Parkinson, PA-C ?01/22/22 1420 ? ?  ?Milton Ferguson, MD ?01/23/22 1059 ? ?

## 2022-01-22 NOTE — Progress Notes (Addendum)
For Short Stay: ?[place] appointment date: N/A ?Date of COVID positive in last 90 days: N/A ? ?Bowel Prep reminder:N/A ? ? ?For Anesthesia: ?PCP - Caryl Bis, MD ?Cardiologist - N/A ? ?Chest x-ray - N/A ?EKG - N/A ?Stress Test - 10/13/20 [facility] ?ECHO - N/A ?Cardiac Cath - N/A ?Pacemaker/ICD device last checked: N/A ?Pacemaker orders received: N/A ?Device Rep notified: N/A ? ?Spinal Cord Stimulator:N/A ? ?Sleep Study - N/A ?CPAP - N/A ? ?Fasting Blood Sugar - N/A ?Checks Blood Sugar __N/A___ times a day ?Date and result of last Hgb A1c-N/A ? ?Blood Thinner Instructions: N/A ?Aspirin Instructions: N/A ?Last Dose:N/A ? ?Activity level: Can go up a flight of stairs and activities of daily living without stopping and without chest pain and/or shortness of breath ?     ? ?Anesthesia review: N/A ? ?Patient denies shortness of breath, fever, cough and chest pain at PAT appointment ? ? ?Patient verbalized understanding of instructions that were given to them at the PAT appointment. Patient was also instructed that they will need to review over the PAT instructions again at home before surgery.  ?

## 2022-01-23 ENCOUNTER — Other Ambulatory Visit: Payer: Self-pay

## 2022-01-23 ENCOUNTER — Encounter (HOSPITAL_COMMUNITY): Payer: Self-pay | Admitting: Orthopedic Surgery

## 2022-01-25 ENCOUNTER — Ambulatory Visit (HOSPITAL_COMMUNITY)
Admission: RE | Admit: 2022-01-25 | Discharge: 2022-01-25 | Disposition: A | Discharge status: Home / Self Care | Payer: Medicare Other | Attending: Orthopedic Surgery | Admitting: Orthopedic Surgery

## 2022-01-25 ENCOUNTER — Encounter (HOSPITAL_COMMUNITY)
Admission: RE | Disposition: A | Discharge status: Home / Self Care | Payer: Self-pay | Source: Home / Self Care | Attending: Orthopedic Surgery

## 2022-01-25 ENCOUNTER — Ambulatory Visit (HOSPITAL_COMMUNITY): Payer: Medicare Other | Admitting: Anesthesiology

## 2022-01-25 ENCOUNTER — Other Ambulatory Visit: Payer: Self-pay

## 2022-01-25 ENCOUNTER — Ambulatory Visit (HOSPITAL_BASED_OUTPATIENT_CLINIC_OR_DEPARTMENT_OTHER): Payer: Medicare Other | Admitting: Anesthesiology

## 2022-01-25 ENCOUNTER — Ambulatory Visit (HOSPITAL_COMMUNITY): Payer: Medicare Other

## 2022-01-25 ENCOUNTER — Other Ambulatory Visit: Payer: Medicare Other

## 2022-01-25 ENCOUNTER — Encounter (HOSPITAL_COMMUNITY): Payer: Self-pay | Admitting: Orthopedic Surgery

## 2022-01-25 DIAGNOSIS — K219 Gastro-esophageal reflux disease without esophagitis: Secondary | ICD-10-CM | POA: Diagnosis not present

## 2022-01-25 DIAGNOSIS — M1990 Unspecified osteoarthritis, unspecified site: Secondary | ICD-10-CM | POA: Diagnosis not present

## 2022-01-25 DIAGNOSIS — F419 Anxiety disorder, unspecified: Secondary | ICD-10-CM | POA: Insufficient documentation

## 2022-01-25 DIAGNOSIS — S42201A Unspecified fracture of upper end of right humerus, initial encounter for closed fracture: Secondary | ICD-10-CM | POA: Diagnosis not present

## 2022-01-25 DIAGNOSIS — I10 Essential (primary) hypertension: Secondary | ICD-10-CM | POA: Insufficient documentation

## 2022-01-25 DIAGNOSIS — S42291A Other displaced fracture of upper end of right humerus, initial encounter for closed fracture: Secondary | ICD-10-CM | POA: Diagnosis not present

## 2022-01-25 DIAGNOSIS — G8918 Other acute postprocedural pain: Secondary | ICD-10-CM | POA: Diagnosis not present

## 2022-01-25 DIAGNOSIS — Z01818 Encounter for other preprocedural examination: Secondary | ICD-10-CM

## 2022-01-25 DIAGNOSIS — Y9222 Religious institution as the place of occurrence of the external cause: Secondary | ICD-10-CM | POA: Insufficient documentation

## 2022-01-25 DIAGNOSIS — W19XXXA Unspecified fall, initial encounter: Secondary | ICD-10-CM | POA: Diagnosis not present

## 2022-01-25 DIAGNOSIS — S42291D Other displaced fracture of upper end of right humerus, subsequent encounter for fracture with routine healing: Secondary | ICD-10-CM | POA: Diagnosis not present

## 2022-01-25 DIAGNOSIS — S42291K Other displaced fracture of upper end of right humerus, subsequent encounter for fracture with nonunion: Secondary | ICD-10-CM | POA: Diagnosis not present

## 2022-01-25 HISTORY — PX: ORIF HUMERUS FRACTURE: SHX2126

## 2022-01-25 HISTORY — DX: Hypothyroidism, unspecified: E03.9

## 2022-01-25 HISTORY — DX: Gastro-esophageal reflux disease without esophagitis: K21.9

## 2022-01-25 LAB — BASIC METABOLIC PANEL
Anion gap: 12 (ref 5–15)
BUN: 17 mg/dL (ref 8–23)
CO2: 24 mmol/L (ref 22–32)
Calcium: 9.2 mg/dL (ref 8.9–10.3)
Chloride: 97 mmol/L — ABNORMAL LOW (ref 98–111)
Creatinine, Ser: 0.92 mg/dL (ref 0.44–1.00)
GFR, Estimated: >60 mL/min (ref >60)
Glucose, Bld: 126 mg/dL — ABNORMAL HIGH (ref 70–99)
Potassium: 2.9 mmol/L — ABNORMAL LOW (ref 3.5–5.1)
Sodium: 133 mmol/L — ABNORMAL LOW (ref 135–145)

## 2022-01-25 LAB — SURGICAL PCR SCREEN
MRSA, PCR: NEGATIVE
Staphylococcus aureus: NEGATIVE

## 2022-01-25 SURGERY — OPEN REDUCTION INTERNAL FIXATION (ORIF) PROXIMAL HUMERUS FRACTURE
Anesthesia: General | Laterality: Right

## 2022-01-25 MED ORDER — OXYCODONE-ACETAMINOPHEN 5-325 MG PO TABS
1.0000 | ORAL_TABLET | ORAL | 0 refills | Status: AC | PRN
Start: 2022-01-25 — End: 2023-01-25

## 2022-01-25 MED ORDER — ONDANSETRON HCL 4 MG/2ML IJ SOLN
INTRAMUSCULAR | Status: AC
  Filled 2022-01-25: qty 2

## 2022-01-25 MED ORDER — EPHEDRINE SULFATE-NACL 50-0.9 MG/10ML-% IV SOSY
PREFILLED_SYRINGE | INTRAVENOUS | Status: DC | PRN
  Administered 2022-01-25 (×3): 5 mg via INTRAVENOUS

## 2022-01-25 MED ORDER — LIDOCAINE 2% (20 MG/ML) 5 ML SYRINGE
INTRAMUSCULAR | Status: DC | PRN
Start: 2022-01-25 — End: 2022-01-25
  Administered 2022-01-25: 60 mg via INTRAVENOUS

## 2022-01-25 MED ORDER — MIDAZOLAM HCL 2 MG/2ML IJ SOLN
1.0000 mg | INTRAMUSCULAR | Status: DC
  Filled 2022-01-25: qty 2

## 2022-01-25 MED ORDER — PHENYLEPHRINE HCL-NACL 20-0.9 MG/250ML-% IV SOLN
INTRAVENOUS | Status: DC | PRN
  Administered 2022-01-25: 50 ug/min via INTRAVENOUS

## 2022-01-25 MED ORDER — PHENYLEPHRINE HCL (PRESSORS) 10 MG/ML IV SOLN
INTRAVENOUS | Status: AC
  Filled 2022-01-25: qty 1

## 2022-01-25 MED ORDER — CEFAZOLIN SODIUM-DEXTROSE 2-4 GM/100ML-% IV SOLN
2.0000 g | INTRAVENOUS | Status: AC
  Administered 2022-01-25: 2 g via INTRAVENOUS
  Filled 2022-01-25: qty 100

## 2022-01-25 MED ORDER — FENTANYL CITRATE (PF) 100 MCG/2ML IJ SOLN
INTRAMUSCULAR | Status: AC
Start: 2022-01-25 — End: ?
  Filled 2022-01-25: qty 2

## 2022-01-25 MED ORDER — ORAL CARE MOUTH RINSE: 15.0000 mL | Freq: Once | OROMUCOSAL | Status: AC

## 2022-01-25 MED ORDER — SENNA-DOCUSATE SODIUM 8.6-50 MG PO TABS: 2.0000 | ORAL_TABLET | Freq: Every day | ORAL | 1 refills | Status: AC

## 2022-01-25 MED ORDER — PROPOFOL 10 MG/ML IV BOLUS
INTRAVENOUS | Status: AC
  Filled 2022-01-25: qty 20

## 2022-01-25 MED ORDER — PROPOFOL 10 MG/ML IV BOLUS
INTRAVENOUS | Status: DC | PRN
  Administered 2022-01-25: 110 mg via INTRAVENOUS

## 2022-01-25 MED ORDER — BUPIVACAINE LIPOSOME 1.3 % IJ SUSP
INTRAMUSCULAR | Status: DC | PRN
  Administered 2022-01-25: 10 mL via PERINEURAL

## 2022-01-25 MED ORDER — EPHEDRINE 5 MG/ML INJ
INTRAVENOUS | Status: AC
  Filled 2022-01-25: qty 5

## 2022-01-25 MED ORDER — POVIDONE-IODINE 10 % EX SWAB
2.0000 "application " | Freq: Once | CUTANEOUS | Status: AC
  Administered 2022-01-25: 2 via TOPICAL

## 2022-01-25 MED ORDER — ACETAMINOPHEN 500 MG PO TABS
1000.0000 mg | ORAL_TABLET | Freq: Once | ORAL | Status: AC
  Administered 2022-01-25: 1000 mg via ORAL
  Filled 2022-01-25: qty 2

## 2022-01-25 MED ORDER — CHLORHEXIDINE GLUCONATE 0.12 % MT SOLN
15.0000 mL | Freq: Once | OROMUCOSAL | Status: AC
  Administered 2022-01-25: 15 mL via OROMUCOSAL

## 2022-01-25 MED ORDER — ONDANSETRON HCL 4 MG/2ML IJ SOLN
INTRAMUSCULAR | Status: DC | PRN
  Administered 2022-01-25: 4 mg via INTRAVENOUS

## 2022-01-25 MED ORDER — FENTANYL CITRATE (PF) 250 MCG/5ML IJ SOLN
INTRAMUSCULAR | Status: DC | PRN
  Administered 2022-01-25 (×2): 50 ug via INTRAVENOUS

## 2022-01-25 MED ORDER — ONDANSETRON HCL 4 MG PO TABS: 4.0000 mg | ORAL_TABLET | Freq: Three times a day (TID) | ORAL | 0 refills | Status: AC | PRN

## 2022-01-25 MED ORDER — TRANEXAMIC ACID-NACL 1000-0.7 MG/100ML-% IV SOLN
1000.0000 mg | INTRAVENOUS | Status: AC
  Administered 2022-01-25: 1000 mg via INTRAVENOUS
  Filled 2022-01-25: qty 100

## 2022-01-25 MED ORDER — ROCURONIUM BROMIDE 10 MG/ML (PF) SYRINGE
PREFILLED_SYRINGE | INTRAVENOUS | Status: DC | PRN
  Administered 2022-01-25: 50 mg via INTRAVENOUS
  Administered 2022-01-25: 20 mg via INTRAVENOUS
  Administered 2022-01-25: 30 mg via INTRAVENOUS

## 2022-01-25 MED ORDER — OXYCODONE HCL 5 MG PO TABS: 5.0000 mg | ORAL_TABLET | ORAL | 0 refills | Status: DC | PRN

## 2022-01-25 MED ORDER — FENTANYL CITRATE PF 50 MCG/ML IJ SOSY
50.0000 ug | PREFILLED_SYRINGE | INTRAMUSCULAR | Status: DC
  Administered 2022-01-25: 25 ug via INTRAVENOUS
  Filled 2022-01-25: qty 2

## 2022-01-25 MED ORDER — BUPIVACAINE HCL (PF) 0.5 % IJ SOLN
INTRAMUSCULAR | Status: DC | PRN
  Administered 2022-01-25: 12 mL via PERINEURAL

## 2022-01-25 MED ORDER — LIDOCAINE HCL (PF) 2 % IJ SOLN
INTRAMUSCULAR | Status: AC
  Filled 2022-01-25: qty 5

## 2022-01-25 MED ORDER — DEXAMETHASONE SODIUM PHOSPHATE 10 MG/ML IJ SOLN
INTRAMUSCULAR | Status: AC
  Filled 2022-01-25: qty 1

## 2022-01-25 MED ORDER — SUGAMMADEX SODIUM 200 MG/2ML IV SOLN
INTRAVENOUS | Status: DC | PRN
  Administered 2022-01-25: 200 mg via INTRAVENOUS

## 2022-01-25 MED ORDER — ROCURONIUM BROMIDE 10 MG/ML (PF) SYRINGE
PREFILLED_SYRINGE | INTRAVENOUS | Status: AC
  Filled 2022-01-25: qty 10

## 2022-01-25 MED ORDER — LACTATED RINGERS IV SOLN: INTRAVENOUS | Status: DC

## 2022-01-25 MED ORDER — DEXAMETHASONE SODIUM PHOSPHATE 10 MG/ML IJ SOLN
INTRAMUSCULAR | Status: DC | PRN
  Administered 2022-01-25: 5 mg via INTRAVENOUS

## 2022-01-25 SURGICAL SUPPLY — 68 items
APL SKNCLS STERI-STRIP NONHPOA (GAUZE/BANDAGES/DRESSINGS) ×2
BAG COUNTER SPONGE SURGICOUNT (BAG) IMPLANT
BAG SPNG CNTER NS LX DISP (BAG)
BENZOIN TINCTURE PRP APPL 2/3 (GAUZE/BANDAGES/DRESSINGS) ×6 IMPLANT
BIT DRILL 3.2 (BIT) ×2
BIT DRILL 3.2XCALB NS DISP (BIT) IMPLANT
BIT DRILL CALIBRATED 2.7 (BIT) ×1 IMPLANT
BIT DRL 3.2XCALB NS DISP (BIT) ×1
BNDG GAUZE ELAST 4 BULKY (GAUZE/BANDAGES/DRESSINGS) ×6 IMPLANT
CLSR STERI-STRIP ANTIMIC 1/2X4 (GAUZE/BANDAGES/DRESSINGS) ×1 IMPLANT
COVER SURGICAL LIGHT HANDLE (MISCELLANEOUS) ×3 IMPLANT
DRAPE C-ARM 42X120 X-RAY (DRAPES) ×3 IMPLANT
DRAPE INCISE IOBAN 66X45 STRL (DRAPES) IMPLANT
DRAPE SURG 17X11 SM STRL (DRAPES) ×6 IMPLANT
DRAPE U-SHAPE 47X51 STRL (DRAPES) ×3 IMPLANT
DRSG ADAPTIC 3X8 NADH LF (GAUZE/BANDAGES/DRESSINGS) ×3 IMPLANT
DRSG EMULSION OIL 3X16 NADH (GAUZE/BANDAGES/DRESSINGS) ×1 IMPLANT
DRSG MEPILEX BORDER 4X8 (GAUZE/BANDAGES/DRESSINGS) ×1 IMPLANT
DRSG PAD ABDOMINAL 8X10 ST (GAUZE/BANDAGES/DRESSINGS) ×5 IMPLANT
ELECT REM PT RETURN 15FT ADLT (MISCELLANEOUS) ×3 IMPLANT
EVACUATOR 1/8 PVC DRAIN (DRAIN) IMPLANT
GAUZE SPONGE 4X4 12PLY STRL (GAUZE/BANDAGES/DRESSINGS) ×5 IMPLANT
GLOVE BIO SURGEON STRL SZ7.5 (GLOVE) ×3 IMPLANT
GLOVE BIOGEL PI IND STRL 8 (GLOVE) ×2 IMPLANT
GLOVE BIOGEL PI INDICATOR 8 (GLOVE) ×1
GOWN STRL REUS W/ TWL LRG LVL3 (GOWN DISPOSABLE) ×4 IMPLANT
GOWN STRL REUS W/TWL LRG LVL3 (GOWN DISPOSABLE) ×4
K-WIRE 2X5 SS THRDED S3 (WIRE) ×4
KIT BASIN OR (CUSTOM PROCEDURE TRAY) ×3 IMPLANT
KIT TURNOVER KIT A (KITS) IMPLANT
KWIRE 2X5 SS THRDED S3 (WIRE) IMPLANT
MANIFOLD NEPTUNE II (INSTRUMENTS) ×3 IMPLANT
NS IRRIG 1000ML POUR BTL (IV SOLUTION) ×3 IMPLANT
PACK ORTHO EXTREMITY (CUSTOM PROCEDURE TRAY) ×3 IMPLANT
PEG LOCKING 3.2X32 (Peg) ×1 IMPLANT
PEG LOCKING 3.2X34 (Screw) ×2 IMPLANT
PEG LOCKING 3.2X36 (Screw) ×1 IMPLANT
PEG LOCKING 3.2X40 (Peg) ×1 IMPLANT
PEG LOCKING 3.2X48 (Peg) ×2 IMPLANT
PENCIL SMOKE EVACUATOR (MISCELLANEOUS) IMPLANT
PLATE PROX HUM LO R 4H 83 (Plate) ×1 IMPLANT
PROTECTOR NERVE ULNAR (MISCELLANEOUS) ×3 IMPLANT
SCREW LOCK CORT STAR 3.5X24 (Screw) ×1 IMPLANT
SCREW LOCK CORT STAR 3.5X26 (Screw) ×1 IMPLANT
SCREW LP NL T15 3.5X26 (Screw) ×1 IMPLANT
SLEEVE MEASURING 3.2 (BIT) ×1 IMPLANT
SLING ARM FOAM STRAP LRG (SOFTGOODS) ×1 IMPLANT
SLING ARM IMMOBILIZER LRG (SOFTGOODS) ×3 IMPLANT
SLING ARM IMMOBILIZER MED (SOFTGOODS) IMPLANT
SOL PREP POV-IOD 4OZ 10% (MISCELLANEOUS) ×3 IMPLANT
SOL SCRUB PVP POV-IOD 4OZ 7.5% (MISCELLANEOUS) ×2
SOLUTION SCRB POV-IOD 4OZ 7.5% (MISCELLANEOUS) ×2 IMPLANT
SPONGE T-LAP 18X18 ~~LOC~~+RFID (SPONGE) IMPLANT
STAPLER VISISTAT 35W (STAPLE) IMPLANT
STOCKINETTE 8 INCH (MISCELLANEOUS) ×3 IMPLANT
SUCTION FRAZIER HANDLE 10FR (MISCELLANEOUS) ×2
SUCTION TUBE FRAZIER 10FR DISP (MISCELLANEOUS) ×2 IMPLANT
SUT ETHIBOND 2 0 SH (SUTURE) ×2
SUT ETHIBOND 2 0 SH 36X2 (SUTURE) ×2 IMPLANT
SUT MAXBRAID #2 CVD NDL (SUTURE) ×2 IMPLANT
SUT MNCRL AB 4-0 PS2 18 (SUTURE) ×3 IMPLANT
SUT VIC AB 0 CT1 36 (SUTURE) ×3 IMPLANT
SUT VIC AB 3-0 SH 18 (SUTURE) ×3 IMPLANT
SUT VIC AB 4-0 PS2 27 (SUTURE) ×3 IMPLANT
TAPE CLOTH SURG 4X10 WHT LF (GAUZE/BANDAGES/DRESSINGS) ×1 IMPLANT
TOWEL OR 17X26 10 PK STRL BLUE (TOWEL DISPOSABLE) ×6 IMPLANT
TRAY FOLEY MTR SLVR 16FR STAT (SET/KITS/TRAYS/PACK) IMPLANT
WATER STERILE IRR 1000ML POUR (IV SOLUTION) ×3 IMPLANT

## 2022-01-25 NOTE — Anesthesia Procedure Notes (Signed)
Procedure Name: Intubation ?Date/Time: 01/25/2022 1:03 PM ?Performed by: Lollie Sails, CRNA ?Pre-anesthesia Checklist: Patient identified, Emergency Drugs available, Suction available, Patient being monitored and Timeout performed ?Patient Re-evaluated:Patient Re-evaluated prior to induction ?Oxygen Delivery Method: Circle system utilized ?Preoxygenation: Pre-oxygenation with 100% oxygen ?Induction Type: IV induction ?Ventilation: Mask ventilation without difficulty ?Laryngoscope Size: Sabra Heck and 3 ?Grade View: Grade II ?Tube type: Oral ?Tube size: 7.5 mm ?Number of attempts: 1 ?Airway Equipment and Method: Stylet ?Placement Confirmation: ETT inserted through vocal cords under direct vision, positive ETCO2 and breath sounds checked- equal and bilateral ?Secured at: 23 cm ?Tube secured with: Tape ?Dental Injury: Teeth and Oropharynx as per pre-operative assessment  ? ? ? ? ?

## 2022-01-25 NOTE — Anesthesia Preprocedure Evaluation (Addendum)
Anesthesia Evaluation  ?Patient identified by MRN, date of birth, ID band ?Patient awake ? ? ? ?Reviewed: ?Allergy & Precautions, NPO status , Patient's Chart, lab work & pertinent test results ? ?Airway ?Mallampati: III ? ?TM Distance: >3 FB ?Neck ROM: Full ? ? ? Dental ? ?(+) Teeth Intact, Dental Advisory Given ?  ?Pulmonary ? ?  ?breath sounds clear to auscultation ? ? ? ? ? ? Cardiovascular ?hypertension, Pt. on medications ? ?Rhythm:Regular Rate:Normal ? ? ?  ?Neuro/Psych ? Headaches, PSYCHIATRIC DISORDERS Anxiety   ? GI/Hepatic ?Neg liver ROS, GERD  Medicated,  ?Endo/Other  ?Hypothyroidism  ? Renal/GU ?negative Renal ROS  ? ?  ?Musculoskeletal ? ?(+) Arthritis ,  ? Abdominal ?Normal abdominal exam  (+)   ?Peds ? Hematology ?negative hematology ROS ?(+)   ?Anesthesia Other Findings ? ? Reproductive/Obstetrics ? ?  ? ? ? ? ? ? ? ? ? ? ? ? ? ?  ?  ? ? ? ? ? ? ? ?Anesthesia Physical ?Anesthesia Plan ? ?ASA: 2 ? ?Anesthesia Plan: General  ? ?Post-op Pain Management: Regional block*  ? ?Induction: Intravenous ? ?PONV Risk Score and Plan: 4 or greater and Ondansetron, Dexamethasone and Treatment may vary due to age or medical condition ? ?Airway Management Planned: Oral ETT ? ?Additional Equipment: None ? ?Intra-op Plan:  ? ?Post-operative Plan: Extubation in OR ? ?Informed Consent: I have reviewed the patients History and Physical, chart, labs and discussed the procedure including the risks, benefits and alternatives for the proposed anesthesia with the patient or authorized representative who has indicated his/her understanding and acceptance.  ? ? ? ?Dental advisory given ? ?Plan Discussed with: CRNA ? ?Anesthesia Plan Comments:   ? ? ? ? ? ?Anesthesia Quick Evaluation ? ?

## 2022-01-25 NOTE — Anesthesia Procedure Notes (Signed)
Anesthesia Regional Block: Interscalene brachial plexus block  ? ?Pre-Anesthetic Checklist: , timeout performed,  Correct Patient, Correct Site, Correct Laterality,  Correct Procedure, Correct Position, site marked,  Risks and benefits discussed,  Surgical consent,  Pre-op evaluation,  At surgeon's request and post-op pain management ? ?Laterality: Right ? ?Prep: chloraprep     ?  ?Needles:  ?Injection technique: Single-shot ? ?Needle Type: Echogenic Stimulator Needle   ? ? ?Needle Length: 9cm  ?Needle Gauge: 21  ? ? ? ?Additional Needles: ? ? ?Procedures:,,,, ultrasound used (permanent image in chart),,    ?Narrative:  ?Start time: 01/25/2022 11:25 AM ?End time: 01/25/2022 11:30 AM ?Injection made incrementally with aspirations every 5 mL. ? ?Performed by: Personally  ?Anesthesiologist: Effie Berkshire, MD ? ?Additional Notes: ?Patient tolerated the procedure well. Local anesthetic introduced in an incremental fashion under minimal resistance after negative aspirations. No paresthesias were elicited. After completion of the procedure, no acute issues were identified and patient continued to be monitored by RN.  ? ? ? ? ? ?

## 2022-01-25 NOTE — H&P (Signed)
PREOPERATIVE H&P ? ?Chief Complaint: right shoulder fracture ? ?HPI: ?Amanda Bowman is a 76 y.o. female who presents for preoperative history and physical with a diagnosis of right proximal humerus fracture. Symptoms are rated as moderate to severe, and have been worsening.  This is significantly impairing activities of daily living.  She has elected for surgical management. Fall occurred 01/18/2022 leaving church.   ? ?Past Medical History:  ?Diagnosis Date  ? Anxiety   ? GERD (gastroesophageal reflux disease)   ? Hypertension   ? Hypothyroidism   ? Migraine   ? ?Past Surgical History:  ?Procedure Laterality Date  ? ANKLE FRACTURE SURGERY Right   ? APPENDECTOMY    ? CHOLECYSTECTOMY    ? TONSILLECTOMY    ? TUBAL LIGATION    ? ?Social History  ? ?Socioeconomic History  ? Marital status: Married  ?  Spouse name: Not on file  ? Number of children: Not on file  ? Years of education: Not on file  ? Highest education level: Not on file  ?Occupational History  ? Not on file  ?Tobacco Use  ? Smoking status: Never  ? Smokeless tobacco: Never  ?Vaping Use  ? Vaping Use: Never used  ?Substance and Sexual Activity  ? Alcohol use: No  ?  Alcohol/week: 0.0 standard drinks  ? Drug use: No  ? Sexual activity: Not on file  ?Other Topics Concern  ? Not on file  ?Social History Narrative  ? ** Merged History Encounter **  ?    ? ?Social Determinants of Health  ? ?Financial Resource Strain: Not on file  ?Food Insecurity: Not on file  ?Transportation Needs: Not on file  ?Physical Activity: Not on file  ?Stress: Not on file  ?Social Connections: Not on file  ? ?History reviewed. No pertinent family history. ?Allergies  ?Allergen Reactions  ? Codeine   ?  itching  ? Sulfa Antibiotics Other (See Comments)  ?  unknown  ? ?Prior to Admission medications   ?Medication Sig Start Date End Date Taking? Authorizing Provider  ?alendronate (FOSAMAX) 70 MG tablet Take 70 mg by mouth once a week. 11/22/21  Yes [provider]  ?Calcium  Carb-Cholecalciferol 600-20 MG-MCG TABS Take 1 tablet by mouth 2 (two) times daily.   Yes [provider]  ?cyclobenzaprine (FLEXERIL) 10 MG tablet Take 10 mg by mouth 3 (three) times daily as needed for muscle spasms.   Yes [provider]  ?escitalopram (LEXAPRO) 20 MG tablet Take 20 mg by mouth daily. 12/18/21  Yes [provider]  ?hydrochlorothiazide (HYDRODIURIL) 12.5 MG tablet Take 12.5 mg by mouth daily. 01/22/22  Yes [provider]  ?lisinopril (ZESTRIL) 20 MG tablet Take 20 mg by mouth daily.   Yes [provider]  ?loratadine (CLARITIN) 10 MG tablet Take 10 mg by mouth daily.   Yes [provider]  ?Multiple Vitamins-Minerals (WOMENS MULTI VITAMIN & MINERAL) TABS Take 1 tablet by mouth daily.   Yes [provider]  ?omeprazole (PRILOSEC) 20 MG capsule Take 20 mg by mouth 2 (two) times daily before a meal.   Yes [provider]  ?oxyCODONE-acetaminophen (PERCOCET/ROXICET) 5-325 MG tablet Take 1 tablet by mouth every 6 (six) hours as needed for severe pain. 01/19/22  Yes Triplett, Tammy, PA-C  ?oxyCODONE-acetaminophen (PERCOCET/ROXICET) 5-325 MG tablet Take 1 tablet by mouth every 4 (four) hours as needed. 01/19/22  Yes Triplett, Tammy, PA-C  ?risperiDONE (RISPERDAL) 1 MG tablet Take 1 mg by mouth at bedtime.  Yes [provider]  ?rosuvastatin (CRESTOR) 10 MG tablet Take 10 mg by mouth at bedtime. 01/22/22  Yes [provider]  ?topiramate (TOPAMAX) 25 MG capsule Take 25 mg by mouth 2 (two) times daily.   Yes [provider]  ?traZODone (DESYREL) 50 MG tablet Take 50-200 mg by mouth at bedtime.   Yes [provider]  ?ALPRAZolam (XANAX) 0.5 MG tablet Take 0.5 mg by mouth 4 (four) times daily as needed for anxiety.    [provider]  ?methylPREDNISolone (MEDROL DOSEPAK) 4 MG TBPK tablet Take as directed. 12/30/18   Garald Balding, MD  ?polyethylene glycol-electrolytes (NULYTELY/GOLYTELY) 420 g  solution Take 4,000 mLs by mouth once. 12/28/15   Setzer, Rona Ravens, NP  ? ? ? ?Positive ROS: All other systems have been reviewed and were otherwise negative with the exception of those mentioned in the HPI and as above. ? ?Physical Exam: ?General: Alert, no acute distress ?Cardiovascular: No pedal edema ?Respiratory: No cyanosis, no use of accessory musculature ?GI: No organomegaly, abdomen is soft and non-tender ?Skin: No lesions in the area of chief complaint ?Neurologic: Sensation intact distally ?Psychiatric: Patient is competent for consent with normal mood and affect ?Lymphatic: No axillary or cervical lymphadenopathy ? ?MUSCULOSKELETAL: right arm has bruising, unable to move shoulder.  Hand function intact. ? ?Assessment: ?Right proximal humerus fracture, 4 part ? ? ?Plan: ?Plan for Procedure(s): ?OPEN REDUCTION INTERNAL FIXATION (ORIF) PROXIMAL HUMERUS FRACTURE vs REVERSE TOTAL SHOULDER ARTHROPLASTY ? ?The risks benefits and alternatives were discussed with the patient including but not limited to the risks of nonoperative treatment, versus surgical intervention including infection, bleeding, nerve injury, malunion, nonunion, loss of function, the need for revision surgery, hardware prominence, hardware failure, the need for hardware removal, blood clots, cardiopulmonary complications, morbidity, mortality, among others, and they were willing to proceed.   ? ?We also discussed the potential for future arthroplasty if ORIF fails.   ? ? ?Johnny Bridge, MD ?Cell [phone] ? ? ?01/25/2022 ?12:52 PM ? ?

## 2022-01-25 NOTE — Transfer of Care (Signed)
Immediate Anesthesia Transfer of Care Note ? ?Patient: Amanda Bowman ? ?Procedure(s) Performed: OPEN REDUCTION INTERNAL FIXATION (ORIF) PROXIMAL HUMERUS FRACTURE RIGHT (Right) ? ?Patient Location: PACU ? ?Anesthesia Type:GA combined with regional for post-op pain ? ?Level of Consciousness: awake, alert  and patient cooperative ? ?Airway & Oxygen Therapy: Patient Spontanous Breathing and Patient connected to face mask oxygen ? ?Post-op Assessment: Report given to RN and Post -op Vital signs reviewed and stable ? ?Post vital signs: Reviewed and stable ? ?Last Vitals:  ?Vitals Value Taken Time  ?BP 116/97 01/25/22 1530  ?Temp    ?Pulse 118 01/25/22 1531  ?Resp 17 01/25/22 1531  ?SpO2 99 % 01/25/22 1531  ?Vitals shown include unvalidated device data. ? ?Last Pain:  ?Vitals:  ? 01/25/22 1108  ?TempSrc:   ?PainSc: 7   ?   ? ?Patients Stated Pain Goal: 3 (01/25/22 1108) ? ?Complications: No notable events documented. ?

## 2022-01-25 NOTE — Anesthesia Postprocedure Evaluation (Signed)
Anesthesia Post Note ? ?Patient: Amanda Bowman ? ?Procedure(s) Performed: OPEN REDUCTION INTERNAL FIXATION (ORIF) PROXIMAL HUMERUS FRACTURE RIGHT (Right) ? ?  ? ?Patient location during evaluation: PACU ?Anesthesia Type: General ?Level of consciousness: awake and alert ?Pain management: pain level controlled ?Vital Signs Assessment: post-procedure vital signs reviewed and stable ?Respiratory status: spontaneous breathing, nonlabored ventilation, respiratory function stable and patient connected to nasal cannula oxygen ?Cardiovascular status: blood pressure returned to baseline and stable ?Postop Assessment: no apparent nausea or vomiting ?Anesthetic complications: no ? ? ?No notable events documented. ? ?Last Vitals:  ?Vitals:  ? 01/25/22 1700 01/25/22 1715  ?BP: (!) 122/57 127/81  ?Pulse: (!) 102 (!) 102  ?Resp: 17 11  ?Temp:    ?SpO2: 92% 93%  ?  ?Last Pain:  ?Vitals:  ? 01/25/22 1700  ?TempSrc:   ?PainSc: 0-No pain  ? ? ?  ?  ?  ?  ?  ?  ? ?Effie Berkshire ? ? ? ? ?

## 2022-01-25 NOTE — Discharge Instructions (Signed)
Diet: As you were doing prior to hospitalization  ? ?Shower:  May shower but keep the wounds dry, use an occlusive plastic wrap, NO SOAKING IN TUB.  If the bandage gets wet, change with a clean dry gauze.  If you have a splint on, leave the splint in place and keep the splint dry with a plastic bag. ? ?Dressing:  You may change your dressing 3-5 days after surgery, unless you have a splint.  If you have a splint, then just leave the splint in place and we will change your bandages during your first follow-up appointment.   ? ?If you had hand or foot surgery, we will plan to remove your stitches in about 2 weeks in the office.  For all other surgeries, there are sticky tapes (steri-strips) on your wounds and all the stitches are absorbable.  Leave the steri-strips in place when changing your dressings, they will peel off with time, usually 2-3 weeks. ? ?Activity:  Increase activity slowly as tolerated, but follow the weight bearing instructions below.  The rules on driving is that you can not be taking narcotics while you drive, and you must feel in control of the vehicle.   ? ?Weight Bearing:  No bearing weight with right arm ? ?To prevent constipation: you may use a stool softener such as - ? ?Colace (over the counter) 100 mg by mouth twice a day  ?Drink plenty of fluids (prune juice may be helpful) and high fiber foods ?Miralax (over the counter) for constipation as needed.   ? ?Itching:  If you experience itching with your medications, try taking only a single pain pill, or even half a pain pill at a time.  You may take up to 10 pain pills per day, and you can also use benadryl over the counter for itching or also to help with sleep.  ? ?Precautions:  If you experience chest pain or shortness of breath - call 911 immediately for transfer to the hospital emergency department!! ? ?If you develop a fever greater that 101 F, purulent drainage from wound, increased redness or drainage from wound, or calf pain -- Call  the office at [phone]                                                ?Follow- Up Appointment:  Please call for an appointment to be seen in 2 weeks [place] - [phone] ? ? ?  ?

## 2022-01-25 NOTE — Op Note (Signed)
01/25/2022 ? ?3:01 PM ? ?PATIENT:  Amanda Bowman   ? ?PRE-OPERATIVE DIAGNOSIS:  RIGHT PROXIMAL HUMERUS FRACTURE ? ?POST-OPERATIVE DIAGNOSIS:  Same ? ?PROCEDURE:  OPEN REDUCTION INTERNAL FIXATION (ORIF) PROXIMAL HUMERUS FRACTURE RIGHT ? ?SURGEON:  Johnny Bridge, MD ? ?PHYSICIAN ASSISTANT: Merlene Pulling, PA-C, present and scrubbed throughout the case, critical for completion in a timely fashion, and for retraction, instrumentation, and closure. ? ?ANESTHESIA:   General ? ?ESTIMATED BLOOD LOSS: 300 mL ? ?PREOPERATIVE INDICATIONS:  Amanda Bowman is a  76 y.o. female with a diagnosis of RIGHT PROXIMAL HUMERUS FRACTURE who elected for surgical management.   ? ?The risks benefits and alternatives were discussed with the patient including but not limited to the risks of nonoperative treatment, versus surgical intervention including infection, bleeding, nerve injury, malunion, nonunion, the need for revision surgery, hardware prominence, hardware failure, the need for hardware removal, blood clots, cardiopulmonary complications, conversion to arthroplasty, morbidity, mortality, among others, and they were willing to proceed.  Predicted outcome is good, although there will be at least a six to nine month expected recovery.  ? ?OPERATIVE IMPLANTS: Biomet ALPS proximal humerus locking plate. ? ?OPERATIVE FINDINGS: Displaced proximal humerus fracture. ? ?UNIQUE ASPECTS OF THE CASE: There was a significant amount of displacement, but I was able to get the shaft restored adequately to the headpiece.  I did not take apart the greater and lesser tuberosities but did work through the fracture site to reduce them as anatomically as possible and to elevate the head piece.  I did this with a Firefighter.  There was a split that was unicortical heading down towards the shaft fixation, however I had 3 excellent screws and used 2 of them as locking screws, so hopefully the shaft will remain stable.  There was substantial  cortical comminution diffusely around the fracture site itself. ? ?OPERATIVE PROCEDURE: The patient was brought to the operating room and placed in the supine position. General anesthesia was administered. IV antibiotics were given. She was placed in the beach chair position. All bony prominences were padded. The upper extremity was prepped and draped in usual sterile fashion. Deltopectoral incision was performed. ? ?I exposed the fracture site, and placed deep retractors. I did not tenotomize the biceps tendon. This was left in place. I placed supraspinatus and subscapularis stitches, a total of 3 of them, and then reduced the head onto the shaft. This was maintained in satisfactory position. ? ?I applied the plate and secured it into the sliding hole first. I confirmed position of the reduction and the plate with C-arm, and I placed a total of 2 guidewires into the appropriate position in the head. I was satisfied that the plate was distal appropriately, and then secured the plate proximally with smooth pegs, taking care to prevent penetration into the arch articular surface, using C-arm. ? ?I then secured the plate distally 2 distal locking screws. I then passed the max braid sutures from the subscapularis and supraspinatus through the plate and secured the tuberosities. Once complete fixation and reduction of been achieved, took final C-arm pictures, and irrigated the wounds copiously, and repaired the deltopectoral interval with Vicryl followed by Vicryl for the subcutaneous tissue with Monocryl and Steri-Strips for the skin. She was placed in a sling. She had a preoperative regional block as well. She tolerated the procedure well with no complications. ? ?

## 2022-01-28 ENCOUNTER — Encounter (HOSPITAL_COMMUNITY): Payer: Self-pay | Admitting: Orthopedic Surgery

## 2022-02-04 DIAGNOSIS — R519 Headache, unspecified: Secondary | ICD-10-CM | POA: Diagnosis not present

## 2022-02-04 DIAGNOSIS — K219 Gastro-esophageal reflux disease without esophagitis: Secondary | ICD-10-CM | POA: Diagnosis not present

## 2022-02-04 DIAGNOSIS — I10 Essential (primary) hypertension: Secondary | ICD-10-CM | POA: Diagnosis not present

## 2022-02-04 DIAGNOSIS — M80021D Age-related osteoporosis with current pathological fracture, right humerus, subsequent encounter for fracture with routine healing: Secondary | ICD-10-CM | POA: Diagnosis not present

## 2022-02-04 DIAGNOSIS — E785 Hyperlipidemia, unspecified: Secondary | ICD-10-CM | POA: Diagnosis not present

## 2022-02-04 DIAGNOSIS — M6281 Muscle weakness (generalized): Secondary | ICD-10-CM | POA: Diagnosis not present

## 2022-02-06 DIAGNOSIS — E785 Hyperlipidemia, unspecified: Secondary | ICD-10-CM | POA: Diagnosis not present

## 2022-02-06 DIAGNOSIS — M80021D Age-related osteoporosis with current pathological fracture, right humerus, subsequent encounter for fracture with routine healing: Secondary | ICD-10-CM | POA: Diagnosis not present

## 2022-02-06 DIAGNOSIS — I10 Essential (primary) hypertension: Secondary | ICD-10-CM | POA: Diagnosis not present

## 2022-02-06 DIAGNOSIS — M6281 Muscle weakness (generalized): Secondary | ICD-10-CM | POA: Diagnosis not present

## 2022-02-06 DIAGNOSIS — R519 Headache, unspecified: Secondary | ICD-10-CM | POA: Diagnosis not present

## 2022-02-06 DIAGNOSIS — K219 Gastro-esophageal reflux disease without esophagitis: Secondary | ICD-10-CM | POA: Diagnosis not present

## 2022-02-07 DIAGNOSIS — M6281 Muscle weakness (generalized): Secondary | ICD-10-CM | POA: Diagnosis not present

## 2022-02-07 DIAGNOSIS — M80021D Age-related osteoporosis with current pathological fracture, right humerus, subsequent encounter for fracture with routine healing: Secondary | ICD-10-CM | POA: Diagnosis not present

## 2022-02-07 DIAGNOSIS — E785 Hyperlipidemia, unspecified: Secondary | ICD-10-CM | POA: Diagnosis not present

## 2022-02-07 DIAGNOSIS — R519 Headache, unspecified: Secondary | ICD-10-CM | POA: Diagnosis not present

## 2022-02-07 DIAGNOSIS — I10 Essential (primary) hypertension: Secondary | ICD-10-CM | POA: Diagnosis not present

## 2022-02-07 DIAGNOSIS — K219 Gastro-esophageal reflux disease without esophagitis: Secondary | ICD-10-CM | POA: Diagnosis not present

## 2022-02-08 DIAGNOSIS — R519 Headache, unspecified: Secondary | ICD-10-CM | POA: Diagnosis not present

## 2022-02-08 DIAGNOSIS — K219 Gastro-esophageal reflux disease without esophagitis: Secondary | ICD-10-CM | POA: Diagnosis not present

## 2022-02-08 DIAGNOSIS — M6281 Muscle weakness (generalized): Secondary | ICD-10-CM | POA: Diagnosis not present

## 2022-02-08 DIAGNOSIS — I10 Essential (primary) hypertension: Secondary | ICD-10-CM | POA: Diagnosis not present

## 2022-02-08 DIAGNOSIS — M80021D Age-related osteoporosis with current pathological fracture, right humerus, subsequent encounter for fracture with routine healing: Secondary | ICD-10-CM | POA: Diagnosis not present

## 2022-02-08 DIAGNOSIS — E785 Hyperlipidemia, unspecified: Secondary | ICD-10-CM | POA: Diagnosis not present

## 2022-02-11 DIAGNOSIS — M80021D Age-related osteoporosis with current pathological fracture, right humerus, subsequent encounter for fracture with routine healing: Secondary | ICD-10-CM | POA: Diagnosis not present

## 2022-02-11 DIAGNOSIS — I10 Essential (primary) hypertension: Secondary | ICD-10-CM | POA: Diagnosis not present

## 2022-02-11 DIAGNOSIS — K219 Gastro-esophageal reflux disease without esophagitis: Secondary | ICD-10-CM | POA: Diagnosis not present

## 2022-02-11 DIAGNOSIS — E785 Hyperlipidemia, unspecified: Secondary | ICD-10-CM | POA: Diagnosis not present

## 2022-02-11 DIAGNOSIS — M6281 Muscle weakness (generalized): Secondary | ICD-10-CM | POA: Diagnosis not present

## 2022-02-11 DIAGNOSIS — R519 Headache, unspecified: Secondary | ICD-10-CM | POA: Diagnosis not present

## 2022-02-12 DIAGNOSIS — R519 Headache, unspecified: Secondary | ICD-10-CM | POA: Diagnosis not present

## 2022-02-12 DIAGNOSIS — I10 Essential (primary) hypertension: Secondary | ICD-10-CM | POA: Diagnosis not present

## 2022-02-12 DIAGNOSIS — M80021D Age-related osteoporosis with current pathological fracture, right humerus, subsequent encounter for fracture with routine healing: Secondary | ICD-10-CM | POA: Diagnosis not present

## 2022-02-12 DIAGNOSIS — K219 Gastro-esophageal reflux disease without esophagitis: Secondary | ICD-10-CM | POA: Diagnosis not present

## 2022-02-12 DIAGNOSIS — M6281 Muscle weakness (generalized): Secondary | ICD-10-CM | POA: Diagnosis not present

## 2022-02-12 DIAGNOSIS — E785 Hyperlipidemia, unspecified: Secondary | ICD-10-CM | POA: Diagnosis not present

## 2022-02-13 DIAGNOSIS — K219 Gastro-esophageal reflux disease without esophagitis: Secondary | ICD-10-CM | POA: Diagnosis not present

## 2022-02-13 DIAGNOSIS — M80021D Age-related osteoporosis with current pathological fracture, right humerus, subsequent encounter for fracture with routine healing: Secondary | ICD-10-CM | POA: Diagnosis not present

## 2022-02-13 DIAGNOSIS — I10 Essential (primary) hypertension: Secondary | ICD-10-CM | POA: Diagnosis not present

## 2022-02-13 DIAGNOSIS — S42291D Other displaced fracture of upper end of right humerus, subsequent encounter for fracture with routine healing: Secondary | ICD-10-CM | POA: Diagnosis not present

## 2022-02-13 DIAGNOSIS — R519 Headache, unspecified: Secondary | ICD-10-CM | POA: Diagnosis not present

## 2022-02-13 DIAGNOSIS — E785 Hyperlipidemia, unspecified: Secondary | ICD-10-CM | POA: Diagnosis not present

## 2022-02-13 DIAGNOSIS — M6281 Muscle weakness (generalized): Secondary | ICD-10-CM | POA: Diagnosis not present

## 2022-02-18 DIAGNOSIS — I10 Essential (primary) hypertension: Secondary | ICD-10-CM | POA: Diagnosis not present

## 2022-02-18 DIAGNOSIS — K219 Gastro-esophageal reflux disease without esophagitis: Secondary | ICD-10-CM | POA: Diagnosis not present

## 2022-02-18 DIAGNOSIS — M80021D Age-related osteoporosis with current pathological fracture, right humerus, subsequent encounter for fracture with routine healing: Secondary | ICD-10-CM | POA: Diagnosis not present

## 2022-02-18 DIAGNOSIS — E785 Hyperlipidemia, unspecified: Secondary | ICD-10-CM | POA: Diagnosis not present

## 2022-02-18 DIAGNOSIS — M6281 Muscle weakness (generalized): Secondary | ICD-10-CM | POA: Diagnosis not present

## 2022-02-18 DIAGNOSIS — R519 Headache, unspecified: Secondary | ICD-10-CM | POA: Diagnosis not present

## 2022-02-19 DIAGNOSIS — R519 Headache, unspecified: Secondary | ICD-10-CM | POA: Diagnosis not present

## 2022-02-19 DIAGNOSIS — M80021D Age-related osteoporosis with current pathological fracture, right humerus, subsequent encounter for fracture with routine healing: Secondary | ICD-10-CM | POA: Diagnosis not present

## 2022-02-19 DIAGNOSIS — M6281 Muscle weakness (generalized): Secondary | ICD-10-CM | POA: Diagnosis not present

## 2022-02-19 DIAGNOSIS — E785 Hyperlipidemia, unspecified: Secondary | ICD-10-CM | POA: Diagnosis not present

## 2022-02-19 DIAGNOSIS — I10 Essential (primary) hypertension: Secondary | ICD-10-CM | POA: Diagnosis not present

## 2022-02-19 DIAGNOSIS — K219 Gastro-esophageal reflux disease without esophagitis: Secondary | ICD-10-CM | POA: Diagnosis not present

## 2022-02-20 DIAGNOSIS — M6281 Muscle weakness (generalized): Secondary | ICD-10-CM | POA: Diagnosis not present

## 2022-02-20 DIAGNOSIS — K219 Gastro-esophageal reflux disease without esophagitis: Secondary | ICD-10-CM | POA: Diagnosis not present

## 2022-02-20 DIAGNOSIS — M80021D Age-related osteoporosis with current pathological fracture, right humerus, subsequent encounter for fracture with routine healing: Secondary | ICD-10-CM | POA: Diagnosis not present

## 2022-02-20 DIAGNOSIS — E785 Hyperlipidemia, unspecified: Secondary | ICD-10-CM | POA: Diagnosis not present

## 2022-02-20 DIAGNOSIS — I10 Essential (primary) hypertension: Secondary | ICD-10-CM | POA: Diagnosis not present

## 2022-02-20 DIAGNOSIS — R519 Headache, unspecified: Secondary | ICD-10-CM | POA: Diagnosis not present

## 2022-02-21 DIAGNOSIS — M6281 Muscle weakness (generalized): Secondary | ICD-10-CM | POA: Diagnosis not present

## 2022-02-21 DIAGNOSIS — I10 Essential (primary) hypertension: Secondary | ICD-10-CM | POA: Diagnosis not present

## 2022-02-21 DIAGNOSIS — R519 Headache, unspecified: Secondary | ICD-10-CM | POA: Diagnosis not present

## 2022-02-21 DIAGNOSIS — E785 Hyperlipidemia, unspecified: Secondary | ICD-10-CM | POA: Diagnosis not present

## 2022-02-21 DIAGNOSIS — M80021D Age-related osteoporosis with current pathological fracture, right humerus, subsequent encounter for fracture with routine healing: Secondary | ICD-10-CM | POA: Diagnosis not present

## 2022-02-21 DIAGNOSIS — K219 Gastro-esophageal reflux disease without esophagitis: Secondary | ICD-10-CM | POA: Diagnosis not present

## 2022-02-26 DIAGNOSIS — I10 Essential (primary) hypertension: Secondary | ICD-10-CM | POA: Diagnosis not present

## 2022-02-26 DIAGNOSIS — R519 Headache, unspecified: Secondary | ICD-10-CM | POA: Diagnosis not present

## 2022-02-26 DIAGNOSIS — K219 Gastro-esophageal reflux disease without esophagitis: Secondary | ICD-10-CM | POA: Diagnosis not present

## 2022-02-26 DIAGNOSIS — M80021D Age-related osteoporosis with current pathological fracture, right humerus, subsequent encounter for fracture with routine healing: Secondary | ICD-10-CM | POA: Diagnosis not present

## 2022-02-26 DIAGNOSIS — M6281 Muscle weakness (generalized): Secondary | ICD-10-CM | POA: Diagnosis not present

## 2022-02-26 DIAGNOSIS — E785 Hyperlipidemia, unspecified: Secondary | ICD-10-CM | POA: Diagnosis not present

## 2022-02-27 DIAGNOSIS — M6281 Muscle weakness (generalized): Secondary | ICD-10-CM | POA: Diagnosis not present

## 2022-02-27 DIAGNOSIS — M80021D Age-related osteoporosis with current pathological fracture, right humerus, subsequent encounter for fracture with routine healing: Secondary | ICD-10-CM | POA: Diagnosis not present

## 2022-02-27 DIAGNOSIS — R519 Headache, unspecified: Secondary | ICD-10-CM | POA: Diagnosis not present

## 2022-02-27 DIAGNOSIS — I10 Essential (primary) hypertension: Secondary | ICD-10-CM | POA: Diagnosis not present

## 2022-02-27 DIAGNOSIS — K219 Gastro-esophageal reflux disease without esophagitis: Secondary | ICD-10-CM | POA: Diagnosis not present

## 2022-02-27 DIAGNOSIS — E785 Hyperlipidemia, unspecified: Secondary | ICD-10-CM | POA: Diagnosis not present

## 2022-03-08 DIAGNOSIS — R519 Headache, unspecified: Secondary | ICD-10-CM | POA: Diagnosis not present

## 2022-03-08 DIAGNOSIS — M6281 Muscle weakness (generalized): Secondary | ICD-10-CM | POA: Diagnosis not present

## 2022-03-08 DIAGNOSIS — M80021D Age-related osteoporosis with current pathological fracture, right humerus, subsequent encounter for fracture with routine healing: Secondary | ICD-10-CM | POA: Diagnosis not present

## 2022-03-08 DIAGNOSIS — I10 Essential (primary) hypertension: Secondary | ICD-10-CM | POA: Diagnosis not present

## 2022-03-08 DIAGNOSIS — E785 Hyperlipidemia, unspecified: Secondary | ICD-10-CM | POA: Diagnosis not present

## 2022-03-13 DIAGNOSIS — S42291D Other displaced fracture of upper end of right humerus, subsequent encounter for fracture with routine healing: Secondary | ICD-10-CM | POA: Diagnosis not present

## 2022-03-28 DIAGNOSIS — M25511 Pain in right shoulder: Secondary | ICD-10-CM | POA: Diagnosis not present

## 2022-03-29 DIAGNOSIS — S42291S Other displaced fracture of upper end of right humerus, sequela: Secondary | ICD-10-CM | POA: Diagnosis not present

## 2022-04-01 DIAGNOSIS — S42291S Other displaced fracture of upper end of right humerus, sequela: Secondary | ICD-10-CM | POA: Diagnosis not present

## 2022-04-05 DIAGNOSIS — S42291S Other displaced fracture of upper end of right humerus, sequela: Secondary | ICD-10-CM | POA: Diagnosis not present

## 2022-04-08 DIAGNOSIS — S42291S Other displaced fracture of upper end of right humerus, sequela: Secondary | ICD-10-CM | POA: Diagnosis not present

## 2022-04-11 DIAGNOSIS — S42291S Other displaced fracture of upper end of right humerus, sequela: Secondary | ICD-10-CM | POA: Diagnosis not present

## 2022-04-16 DIAGNOSIS — S42291S Other displaced fracture of upper end of right humerus, sequela: Secondary | ICD-10-CM | POA: Diagnosis not present

## 2022-04-19 DIAGNOSIS — S42291S Other displaced fracture of upper end of right humerus, sequela: Secondary | ICD-10-CM | POA: Diagnosis not present

## 2022-04-22 DIAGNOSIS — S42291S Other displaced fracture of upper end of right humerus, sequela: Secondary | ICD-10-CM | POA: Diagnosis not present

## 2022-04-25 DIAGNOSIS — S42291S Other displaced fracture of upper end of right humerus, sequela: Secondary | ICD-10-CM | POA: Diagnosis not present

## 2022-04-26 DIAGNOSIS — E7849 Other hyperlipidemia: Secondary | ICD-10-CM | POA: Diagnosis not present

## 2022-04-26 DIAGNOSIS — K219 Gastro-esophageal reflux disease without esophagitis: Secondary | ICD-10-CM | POA: Diagnosis not present

## 2022-04-26 DIAGNOSIS — I10 Essential (primary) hypertension: Secondary | ICD-10-CM | POA: Diagnosis not present

## 2022-04-29 DIAGNOSIS — S42291S Other displaced fracture of upper end of right humerus, sequela: Secondary | ICD-10-CM | POA: Diagnosis not present

## 2022-04-30 DIAGNOSIS — I10 Essential (primary) hypertension: Secondary | ICD-10-CM | POA: Diagnosis not present

## 2022-04-30 DIAGNOSIS — J301 Allergic rhinitis due to pollen: Secondary | ICD-10-CM | POA: Diagnosis not present

## 2022-04-30 DIAGNOSIS — E7849 Other hyperlipidemia: Secondary | ICD-10-CM | POA: Diagnosis not present

## 2022-04-30 DIAGNOSIS — R4582 Worries: Secondary | ICD-10-CM | POA: Diagnosis not present

## 2022-05-02 DIAGNOSIS — S42291S Other displaced fracture of upper end of right humerus, sequela: Secondary | ICD-10-CM | POA: Diagnosis not present

## 2022-05-07 DIAGNOSIS — S42291S Other displaced fracture of upper end of right humerus, sequela: Secondary | ICD-10-CM | POA: Diagnosis not present

## 2022-05-09 DIAGNOSIS — S42291S Other displaced fracture of upper end of right humerus, sequela: Secondary | ICD-10-CM | POA: Diagnosis not present

## 2022-05-13 DIAGNOSIS — S42291S Other displaced fracture of upper end of right humerus, sequela: Secondary | ICD-10-CM | POA: Diagnosis not present

## 2022-05-14 DIAGNOSIS — R945 Abnormal results of liver function studies: Secondary | ICD-10-CM | POA: Diagnosis not present

## 2022-05-14 DIAGNOSIS — I10 Essential (primary) hypertension: Secondary | ICD-10-CM | POA: Diagnosis not present

## 2022-05-14 DIAGNOSIS — R531 Weakness: Secondary | ICD-10-CM | POA: Diagnosis not present

## 2022-05-14 DIAGNOSIS — K219 Gastro-esophageal reflux disease without esophagitis: Secondary | ICD-10-CM | POA: Diagnosis not present

## 2022-05-16 DIAGNOSIS — S42291S Other displaced fracture of upper end of right humerus, sequela: Secondary | ICD-10-CM | POA: Diagnosis not present

## 2022-05-21 DIAGNOSIS — S42291S Other displaced fracture of upper end of right humerus, sequela: Secondary | ICD-10-CM | POA: Diagnosis not present

## 2022-05-24 DIAGNOSIS — S42291S Other displaced fracture of upper end of right humerus, sequela: Secondary | ICD-10-CM | POA: Diagnosis not present

## 2022-05-28 DIAGNOSIS — S42291S Other displaced fracture of upper end of right humerus, sequela: Secondary | ICD-10-CM | POA: Diagnosis not present

## 2022-05-30 DIAGNOSIS — S42291S Other displaced fracture of upper end of right humerus, sequela: Secondary | ICD-10-CM | POA: Diagnosis not present

## 2022-06-03 DIAGNOSIS — S42291D Other displaced fracture of upper end of right humerus, subsequent encounter for fracture with routine healing: Secondary | ICD-10-CM | POA: Diagnosis not present

## 2022-06-06 DIAGNOSIS — R030 Elevated blood-pressure reading, without diagnosis of hypertension: Secondary | ICD-10-CM | POA: Diagnosis not present

## 2022-06-06 DIAGNOSIS — R42 Dizziness and giddiness: Secondary | ICD-10-CM | POA: Diagnosis not present

## 2022-06-06 DIAGNOSIS — R300 Dysuria: Secondary | ICD-10-CM | POA: Diagnosis not present

## 2022-06-11 DIAGNOSIS — S42291S Other displaced fracture of upper end of right humerus, sequela: Secondary | ICD-10-CM | POA: Diagnosis not present

## 2022-06-14 DIAGNOSIS — S42291S Other displaced fracture of upper end of right humerus, sequela: Secondary | ICD-10-CM | POA: Diagnosis not present

## 2022-06-17 DIAGNOSIS — S42291S Other displaced fracture of upper end of right humerus, sequela: Secondary | ICD-10-CM | POA: Diagnosis not present

## 2022-06-20 DIAGNOSIS — S42291S Other displaced fracture of upper end of right humerus, sequela: Secondary | ICD-10-CM | POA: Diagnosis not present

## 2022-06-24 DIAGNOSIS — S42291S Other displaced fracture of upper end of right humerus, sequela: Secondary | ICD-10-CM | POA: Diagnosis not present

## 2022-06-27 DIAGNOSIS — S42291S Other displaced fracture of upper end of right humerus, sequela: Secondary | ICD-10-CM | POA: Diagnosis not present

## 2022-07-01 DIAGNOSIS — J029 Acute pharyngitis, unspecified: Secondary | ICD-10-CM | POA: Diagnosis not present

## 2022-07-01 DIAGNOSIS — R059 Cough, unspecified: Secondary | ICD-10-CM | POA: Diagnosis not present

## 2022-07-11 DIAGNOSIS — R030 Elevated blood-pressure reading, without diagnosis of hypertension: Secondary | ICD-10-CM | POA: Diagnosis not present

## 2022-07-11 DIAGNOSIS — J329 Chronic sinusitis, unspecified: Secondary | ICD-10-CM | POA: Diagnosis not present

## 2022-07-11 DIAGNOSIS — R059 Cough, unspecified: Secondary | ICD-10-CM | POA: Diagnosis not present

## 2022-07-11 DIAGNOSIS — J029 Acute pharyngitis, unspecified: Secondary | ICD-10-CM | POA: Diagnosis not present

## 2022-08-05 DIAGNOSIS — E8881 Metabolic syndrome: Secondary | ICD-10-CM | POA: Diagnosis not present

## 2022-08-05 DIAGNOSIS — E7849 Other hyperlipidemia: Secondary | ICD-10-CM | POA: Diagnosis not present

## 2022-08-05 DIAGNOSIS — I10 Essential (primary) hypertension: Secondary | ICD-10-CM | POA: Diagnosis not present

## 2022-08-05 DIAGNOSIS — Z23 Encounter for immunization: Secondary | ICD-10-CM | POA: Diagnosis not present

## 2022-08-05 DIAGNOSIS — J301 Allergic rhinitis due to pollen: Secondary | ICD-10-CM | POA: Diagnosis not present

## 2022-08-14 DIAGNOSIS — R296 Repeated falls: Secondary | ICD-10-CM | POA: Diagnosis not present

## 2022-08-19 DIAGNOSIS — R296 Repeated falls: Secondary | ICD-10-CM | POA: Diagnosis not present

## 2022-08-22 DIAGNOSIS — R296 Repeated falls: Secondary | ICD-10-CM | POA: Diagnosis not present

## 2022-08-26 DIAGNOSIS — S42291D Other displaced fracture of upper end of right humerus, subsequent encounter for fracture with routine healing: Secondary | ICD-10-CM | POA: Diagnosis not present

## 2022-08-28 DIAGNOSIS — R296 Repeated falls: Secondary | ICD-10-CM | POA: Diagnosis not present

## 2022-08-29 DIAGNOSIS — R296 Repeated falls: Secondary | ICD-10-CM | POA: Diagnosis not present

## 2022-09-03 DIAGNOSIS — R296 Repeated falls: Secondary | ICD-10-CM | POA: Diagnosis not present

## 2022-09-04 DIAGNOSIS — R296 Repeated falls: Secondary | ICD-10-CM | POA: Diagnosis not present

## 2022-09-10 DIAGNOSIS — R296 Repeated falls: Secondary | ICD-10-CM | POA: Diagnosis not present

## 2022-09-19 DIAGNOSIS — E7849 Other hyperlipidemia: Secondary | ICD-10-CM | POA: Diagnosis not present

## 2022-09-19 DIAGNOSIS — I10 Essential (primary) hypertension: Secondary | ICD-10-CM | POA: Diagnosis not present

## 2022-09-19 DIAGNOSIS — R946 Abnormal results of thyroid function studies: Secondary | ICD-10-CM | POA: Diagnosis not present

## 2022-09-19 DIAGNOSIS — E559 Vitamin D deficiency, unspecified: Secondary | ICD-10-CM | POA: Diagnosis not present

## 2022-09-19 DIAGNOSIS — R945 Abnormal results of liver function studies: Secondary | ICD-10-CM | POA: Diagnosis not present

## 2022-09-19 DIAGNOSIS — K219 Gastro-esophageal reflux disease without esophagitis: Secondary | ICD-10-CM | POA: Diagnosis not present

## 2022-09-19 DIAGNOSIS — Z1329 Encounter for screening for other suspected endocrine disorder: Secondary | ICD-10-CM | POA: Diagnosis not present

## 2022-09-23 DIAGNOSIS — E7849 Other hyperlipidemia: Secondary | ICD-10-CM | POA: Diagnosis not present

## 2022-09-23 DIAGNOSIS — I10 Essential (primary) hypertension: Secondary | ICD-10-CM | POA: Diagnosis not present

## 2022-09-23 DIAGNOSIS — J301 Allergic rhinitis due to pollen: Secondary | ICD-10-CM | POA: Diagnosis not present

## 2022-09-23 DIAGNOSIS — Z23 Encounter for immunization: Secondary | ICD-10-CM | POA: Diagnosis not present

## 2022-09-23 DIAGNOSIS — E876 Hypokalemia: Secondary | ICD-10-CM | POA: Diagnosis not present

## 2022-09-23 DIAGNOSIS — R4582 Worries: Secondary | ICD-10-CM | POA: Diagnosis not present

## 2022-09-23 DIAGNOSIS — E8881 Metabolic syndrome: Secondary | ICD-10-CM | POA: Diagnosis not present

## 2022-09-23 DIAGNOSIS — Z0001 Encounter for general adult medical examination with abnormal findings: Secondary | ICD-10-CM | POA: Diagnosis not present

## 2022-09-23 DIAGNOSIS — E559 Vitamin D deficiency, unspecified: Secondary | ICD-10-CM | POA: Diagnosis not present

## 2022-10-02 DIAGNOSIS — E876 Hypokalemia: Secondary | ICD-10-CM | POA: Diagnosis not present

## 2022-10-02 DIAGNOSIS — Z1231 Encounter for screening mammogram for malignant neoplasm of breast: Secondary | ICD-10-CM | POA: Diagnosis not present

## 2022-10-08 DIAGNOSIS — R296 Repeated falls: Secondary | ICD-10-CM | POA: Diagnosis not present

## 2022-10-09 DIAGNOSIS — R296 Repeated falls: Secondary | ICD-10-CM | POA: Diagnosis not present

## 2022-10-14 DIAGNOSIS — R296 Repeated falls: Secondary | ICD-10-CM | POA: Diagnosis not present

## 2022-10-16 DIAGNOSIS — R296 Repeated falls: Secondary | ICD-10-CM | POA: Diagnosis not present

## 2022-10-23 DIAGNOSIS — R296 Repeated falls: Secondary | ICD-10-CM | POA: Diagnosis not present

## 2022-10-25 DIAGNOSIS — R296 Repeated falls: Secondary | ICD-10-CM | POA: Diagnosis not present

## 2022-10-29 DIAGNOSIS — R296 Repeated falls: Secondary | ICD-10-CM | POA: Diagnosis not present

## 2022-10-31 DIAGNOSIS — R296 Repeated falls: Secondary | ICD-10-CM | POA: Diagnosis not present

## 2022-11-21 DIAGNOSIS — R296 Repeated falls: Secondary | ICD-10-CM | POA: Diagnosis not present

## 2022-11-26 DIAGNOSIS — R296 Repeated falls: Secondary | ICD-10-CM | POA: Diagnosis not present

## 2022-12-03 DIAGNOSIS — R296 Repeated falls: Secondary | ICD-10-CM | POA: Diagnosis not present

## 2022-12-05 DIAGNOSIS — R296 Repeated falls: Secondary | ICD-10-CM | POA: Diagnosis not present

## 2022-12-10 DIAGNOSIS — R296 Repeated falls: Secondary | ICD-10-CM | POA: Diagnosis not present

## 2022-12-12 DIAGNOSIS — R296 Repeated falls: Secondary | ICD-10-CM | POA: Diagnosis not present

## 2022-12-19 DIAGNOSIS — R296 Repeated falls: Secondary | ICD-10-CM | POA: Diagnosis not present

## 2022-12-24 DIAGNOSIS — R296 Repeated falls: Secondary | ICD-10-CM | POA: Diagnosis not present

## 2023-01-06 DIAGNOSIS — M79675 Pain in left toe(s): Secondary | ICD-10-CM | POA: Diagnosis not present

## 2023-01-06 DIAGNOSIS — M79674 Pain in right toe(s): Secondary | ICD-10-CM | POA: Diagnosis not present

## 2023-01-06 DIAGNOSIS — M79672 Pain in left foot: Secondary | ICD-10-CM | POA: Diagnosis not present

## 2023-01-06 DIAGNOSIS — M79671 Pain in right foot: Secondary | ICD-10-CM | POA: Diagnosis not present

## 2023-01-06 DIAGNOSIS — I739 Peripheral vascular disease, unspecified: Secondary | ICD-10-CM | POA: Diagnosis not present

## 2023-01-06 DIAGNOSIS — L110 Acquired keratosis follicularis: Secondary | ICD-10-CM | POA: Diagnosis not present

## 2023-01-15 DIAGNOSIS — E7849 Other hyperlipidemia: Secondary | ICD-10-CM | POA: Diagnosis not present

## 2023-01-15 DIAGNOSIS — I10 Essential (primary) hypertension: Secondary | ICD-10-CM | POA: Diagnosis not present

## 2023-01-15 DIAGNOSIS — E559 Vitamin D deficiency, unspecified: Secondary | ICD-10-CM | POA: Diagnosis not present

## 2023-01-15 DIAGNOSIS — E039 Hypothyroidism, unspecified: Secondary | ICD-10-CM | POA: Diagnosis not present

## 2023-01-15 DIAGNOSIS — E8881 Metabolic syndrome: Secondary | ICD-10-CM | POA: Diagnosis not present

## 2023-01-22 DIAGNOSIS — E7849 Other hyperlipidemia: Secondary | ICD-10-CM | POA: Diagnosis not present

## 2023-01-22 DIAGNOSIS — E876 Hypokalemia: Secondary | ICD-10-CM | POA: Diagnosis not present

## 2023-01-22 DIAGNOSIS — J301 Allergic rhinitis due to pollen: Secondary | ICD-10-CM | POA: Diagnosis not present

## 2023-01-22 DIAGNOSIS — E8881 Metabolic syndrome: Secondary | ICD-10-CM | POA: Diagnosis not present

## 2023-01-22 DIAGNOSIS — I10 Essential (primary) hypertension: Secondary | ICD-10-CM | POA: Diagnosis not present

## 2023-01-22 DIAGNOSIS — R4582 Worries: Secondary | ICD-10-CM | POA: Diagnosis not present

## 2023-01-22 DIAGNOSIS — E559 Vitamin D deficiency, unspecified: Secondary | ICD-10-CM | POA: Diagnosis not present

## 2023-03-22 DIAGNOSIS — R030 Elevated blood-pressure reading, without diagnosis of hypertension: Secondary | ICD-10-CM | POA: Diagnosis not present

## 2023-03-22 DIAGNOSIS — R059 Cough, unspecified: Secondary | ICD-10-CM | POA: Diagnosis not present

## 2023-03-22 DIAGNOSIS — J329 Chronic sinusitis, unspecified: Secondary | ICD-10-CM | POA: Diagnosis not present

## 2023-03-22 DIAGNOSIS — J029 Acute pharyngitis, unspecified: Secondary | ICD-10-CM | POA: Diagnosis not present

## 2023-03-28 DIAGNOSIS — E042 Nontoxic multinodular goiter: Secondary | ICD-10-CM | POA: Diagnosis not present

## 2023-03-28 DIAGNOSIS — Z743 Need for continuous supervision: Secondary | ICD-10-CM | POA: Diagnosis not present

## 2023-03-28 DIAGNOSIS — Z471 Aftercare following joint replacement surgery: Secondary | ICD-10-CM | POA: Diagnosis not present

## 2023-03-28 DIAGNOSIS — Z885 Allergy status to narcotic agent status: Secondary | ICD-10-CM | POA: Diagnosis not present

## 2023-03-28 DIAGNOSIS — K219 Gastro-esophageal reflux disease without esophagitis: Secondary | ICD-10-CM | POA: Diagnosis not present

## 2023-03-28 DIAGNOSIS — I499 Cardiac arrhythmia, unspecified: Secondary | ICD-10-CM | POA: Diagnosis not present

## 2023-03-28 DIAGNOSIS — Z880 Allergy status to penicillin: Secondary | ICD-10-CM | POA: Diagnosis not present

## 2023-03-28 DIAGNOSIS — E785 Hyperlipidemia, unspecified: Secondary | ICD-10-CM | POA: Diagnosis not present

## 2023-03-28 DIAGNOSIS — I10 Essential (primary) hypertension: Secondary | ICD-10-CM | POA: Diagnosis not present

## 2023-03-28 DIAGNOSIS — M1990 Unspecified osteoarthritis, unspecified site: Secondary | ICD-10-CM | POA: Diagnosis not present

## 2023-03-28 DIAGNOSIS — R531 Weakness: Secondary | ICD-10-CM | POA: Diagnosis not present

## 2023-03-28 DIAGNOSIS — R059 Cough, unspecified: Secondary | ICD-10-CM | POA: Diagnosis not present

## 2023-03-28 DIAGNOSIS — Z882 Allergy status to sulfonamides status: Secondary | ICD-10-CM | POA: Diagnosis not present

## 2023-03-28 DIAGNOSIS — R000 Tachycardia, unspecified: Secondary | ICD-10-CM | POA: Diagnosis not present

## 2023-03-28 DIAGNOSIS — R9431 Abnormal electrocardiogram [ECG] [EKG]: Secondary | ICD-10-CM | POA: Diagnosis not present

## 2023-03-31 DIAGNOSIS — I6782 Cerebral ischemia: Secondary | ICD-10-CM | POA: Diagnosis not present

## 2023-03-31 DIAGNOSIS — R531 Weakness: Secondary | ICD-10-CM | POA: Diagnosis not present

## 2023-03-31 DIAGNOSIS — R9089 Other abnormal findings on diagnostic imaging of central nervous system: Secondary | ICD-10-CM | POA: Diagnosis not present

## 2023-04-21 DIAGNOSIS — L820 Inflamed seborrheic keratosis: Secondary | ICD-10-CM | POA: Diagnosis not present

## 2023-04-21 DIAGNOSIS — L989 Disorder of the skin and subcutaneous tissue, unspecified: Secondary | ICD-10-CM | POA: Diagnosis not present

## 2023-05-29 DIAGNOSIS — I10 Essential (primary) hypertension: Secondary | ICD-10-CM | POA: Diagnosis not present

## 2023-05-29 DIAGNOSIS — R197 Diarrhea, unspecified: Secondary | ICD-10-CM | POA: Diagnosis not present

## 2023-05-29 DIAGNOSIS — Z131 Encounter for screening for diabetes mellitus: Secondary | ICD-10-CM | POA: Diagnosis not present

## 2023-05-29 DIAGNOSIS — R4582 Worries: Secondary | ICD-10-CM | POA: Diagnosis not present

## 2023-05-29 DIAGNOSIS — Z23 Encounter for immunization: Secondary | ICD-10-CM | POA: Diagnosis not present

## 2023-05-29 DIAGNOSIS — E782 Mixed hyperlipidemia: Secondary | ICD-10-CM | POA: Diagnosis not present

## 2023-05-29 DIAGNOSIS — E559 Vitamin D deficiency, unspecified: Secondary | ICD-10-CM | POA: Diagnosis not present

## 2023-05-29 DIAGNOSIS — J301 Allergic rhinitis due to pollen: Secondary | ICD-10-CM | POA: Diagnosis not present

## 2023-05-29 DIAGNOSIS — E7849 Other hyperlipidemia: Secondary | ICD-10-CM | POA: Diagnosis not present

## 2023-05-29 DIAGNOSIS — E876 Hypokalemia: Secondary | ICD-10-CM | POA: Diagnosis not present

## 2023-06-23 IMAGING — DX DG HUMERUS 2V *R*
2 series · 2 of 2 positions shown · non-contrast
Comparison: None Available.

CLINICAL DATA: Fall

EXAM:
RIGHT HUMERUS - 2+ VIEW

[humerus ap]
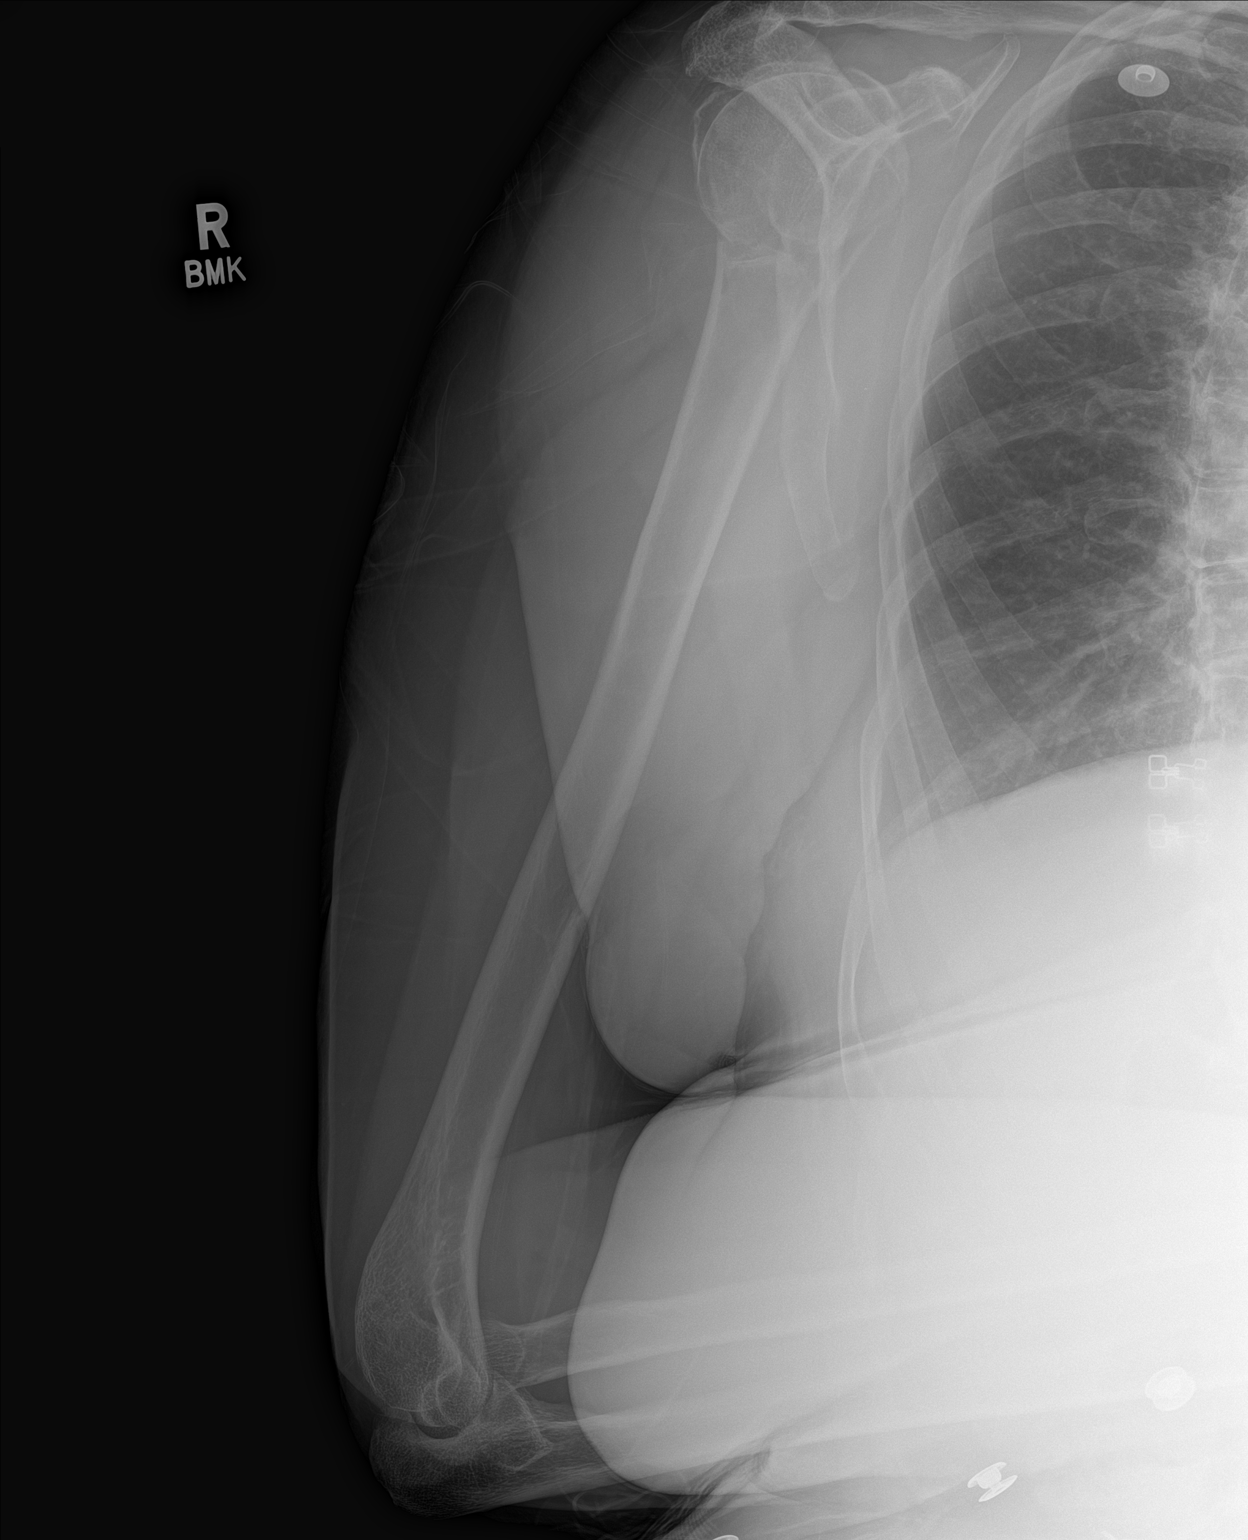

[humerus lat]
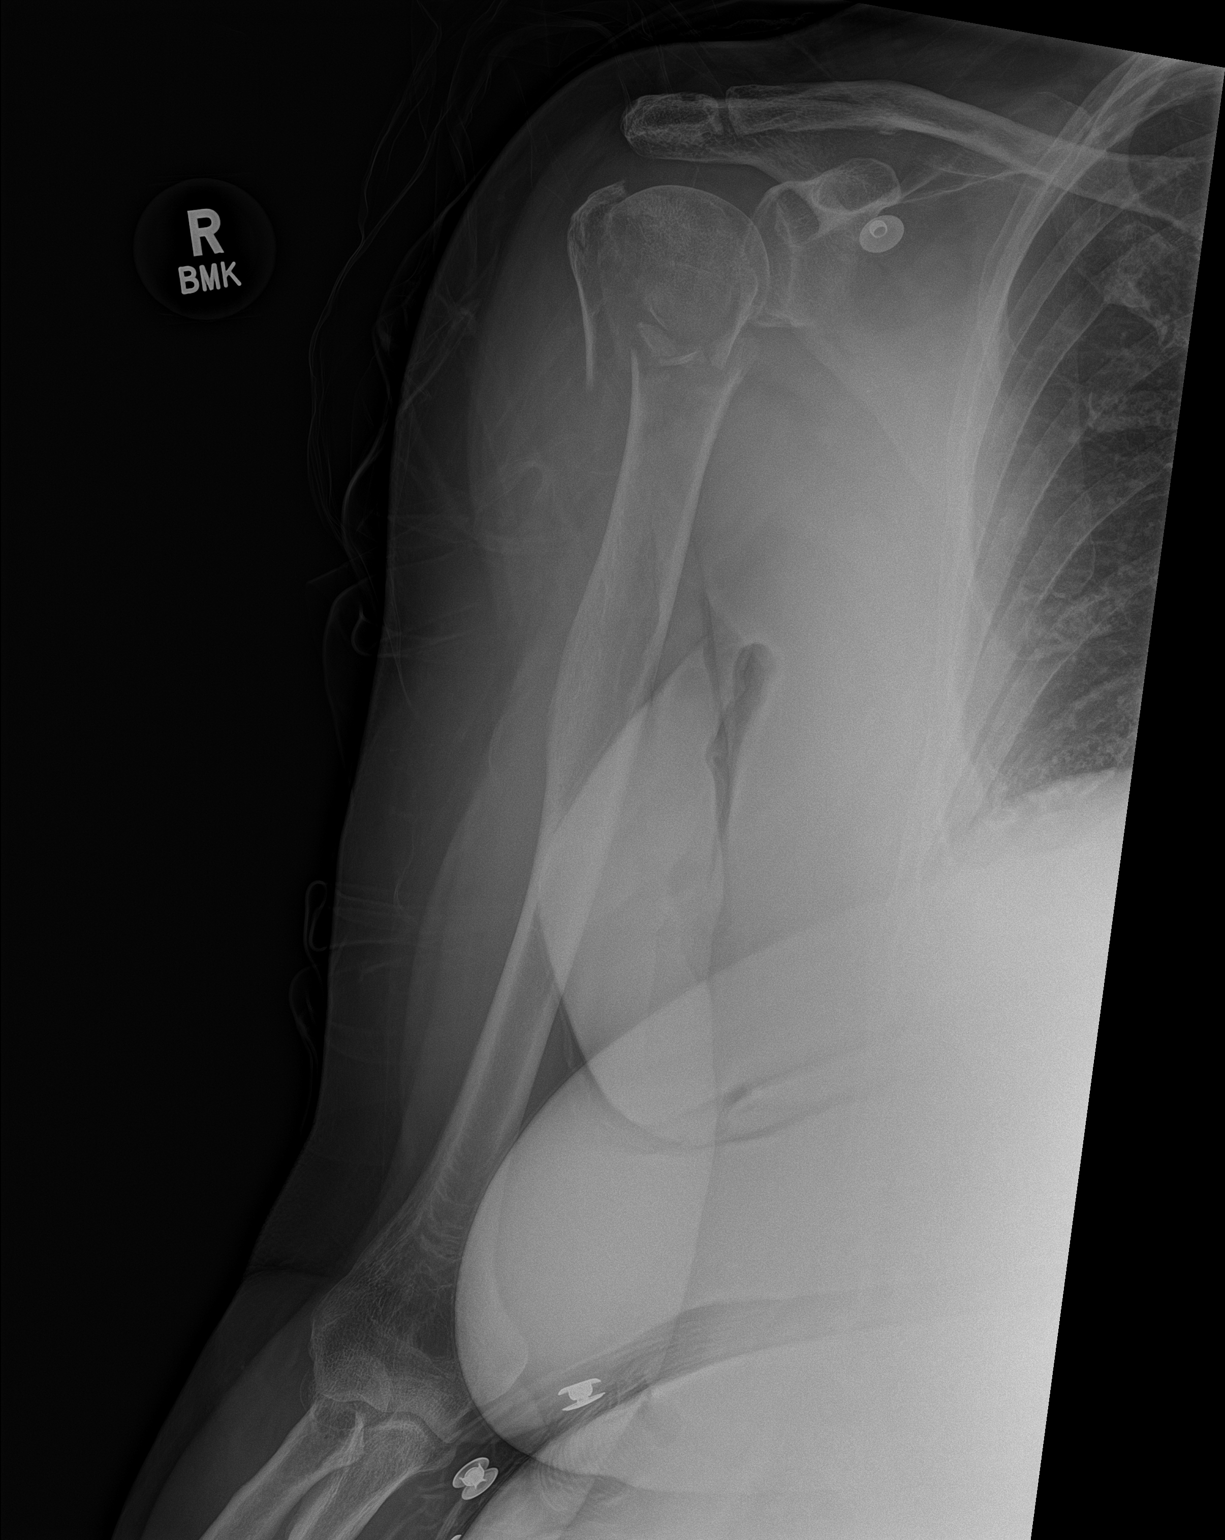

[2 of 2 positions shown; findings below may reference images not displayed]

FINDINGS: Comminuted, mildly placed fracture of the proximal humerus,
including the surgical neck. A fracture of the inferior rim of the
glenoid is also suspected.
IMPRESSION: Comminuted fracture of the proximal humerus. Suspect fracture of the
inferior glenoid.

## 2023-06-25 IMAGING — CT CT SHOULDER*R* W/O CM
1 of 2 series · 9 of 14 positions shown, 12 images · non-contrast
Comparison: Radiograph 01/19/2022

CLINICAL DATA: Right shoulder fracture

EXAM:
CT OF THE UPPER RIGHT EXTREMITY WITHOUT CONTRAST
TECHNIQUE: Multidetector CT imaging of the upper right extremity was performed
according to the standard protocol.
RADIATION DOSE REDUCTION: This exam was performed according to the
departmental dose-optimization program which includes automated
exposure control, adjustment of the mA and/or kV according to
patient size and/or use of iterative reconstruction technique.

[Series 14: shoulder 0.60 br40 s3 thins soft · axial · 0.58mm/px · z∈[-1014,-858]mm · 9 of 326 slices shown, 12 images]
[im 33/326  soft-tissue]
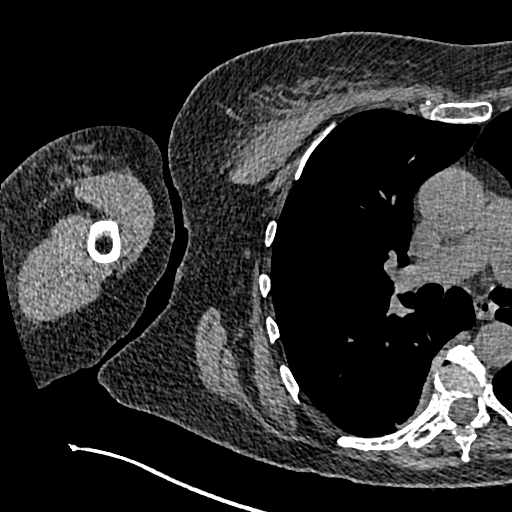
[im 33/326  bone]
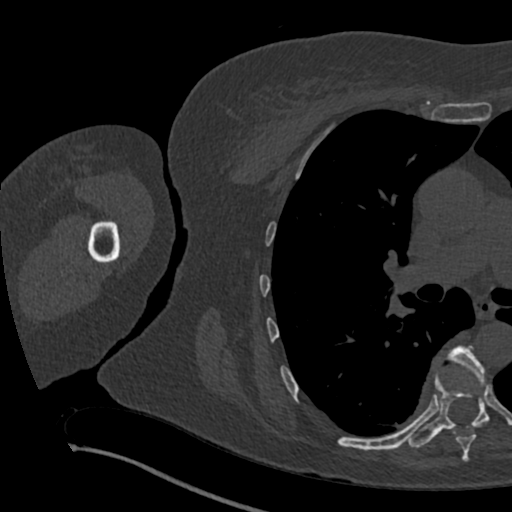
[im 66/326  bone]
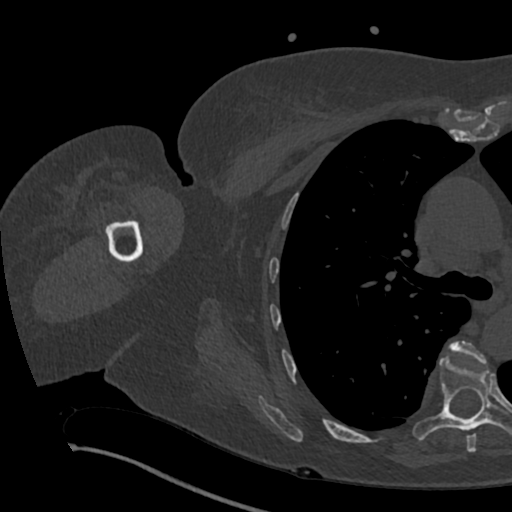
[im 98/326  bone]
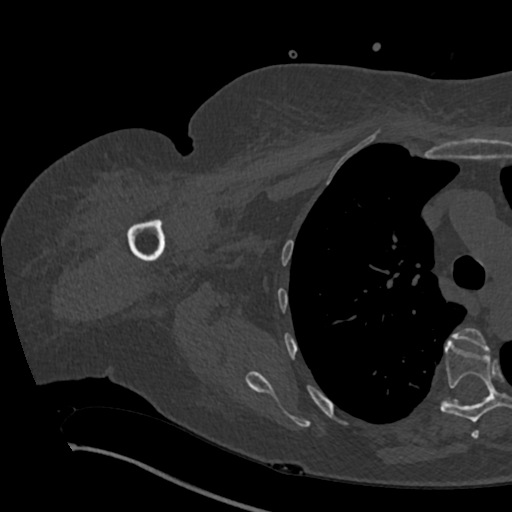
[im 131/326  bone]
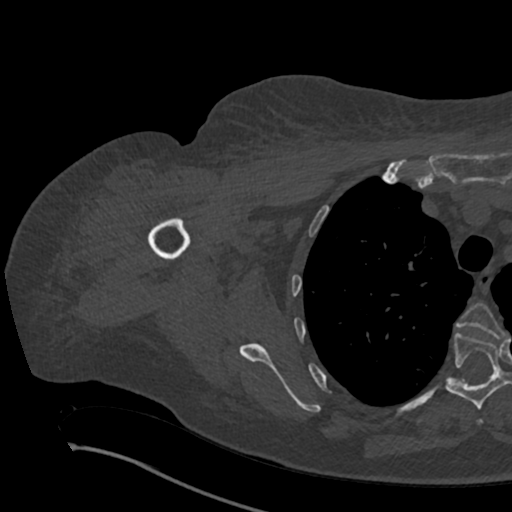
[im 163/326  soft-tissue]
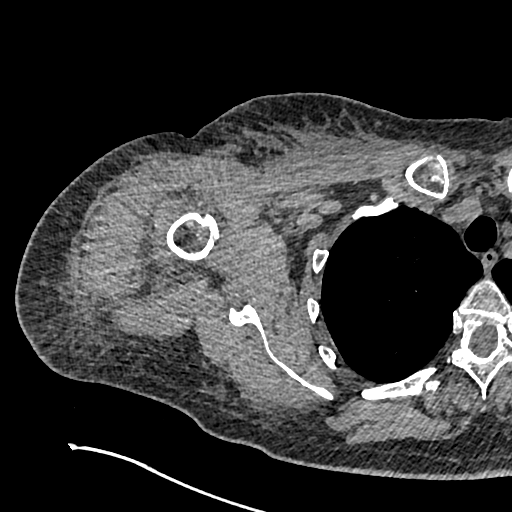
[im 163/326  bone]
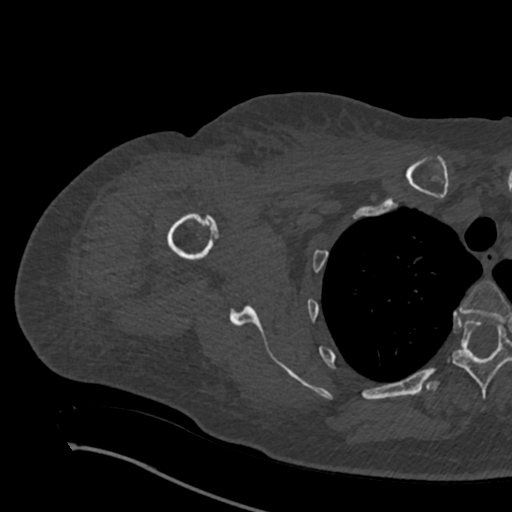
[im 196/326  bone]
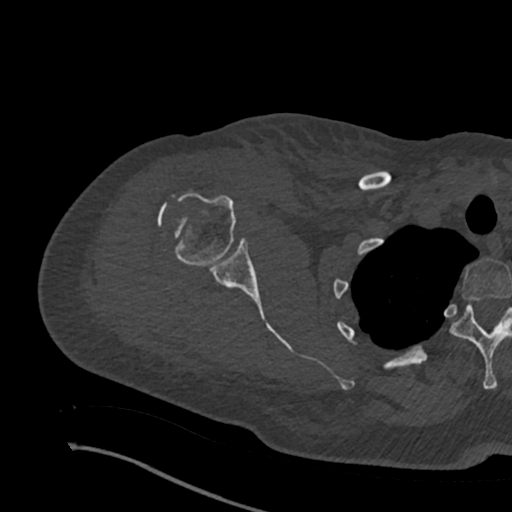
[im 228/326  bone]
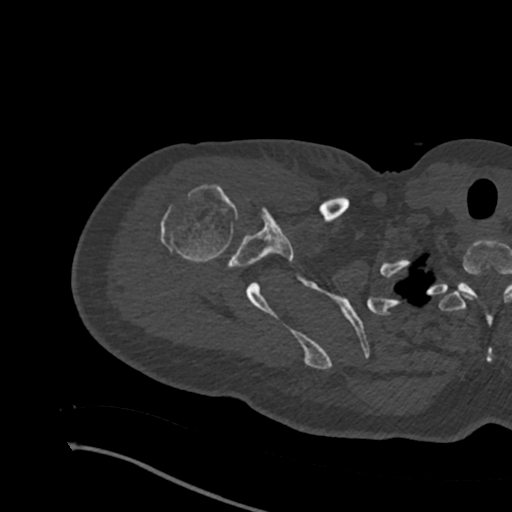
[im 261/326  bone]
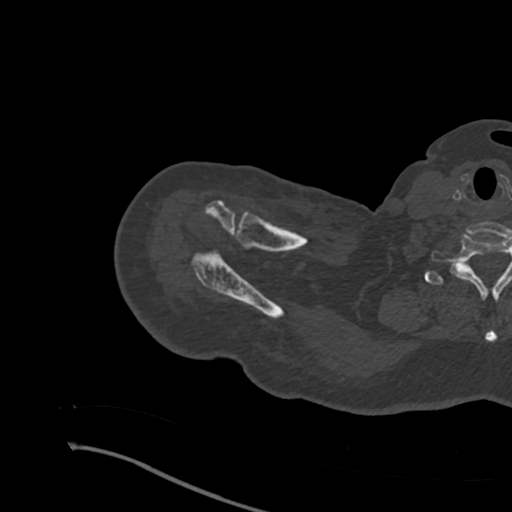
[im 293/326  soft-tissue]
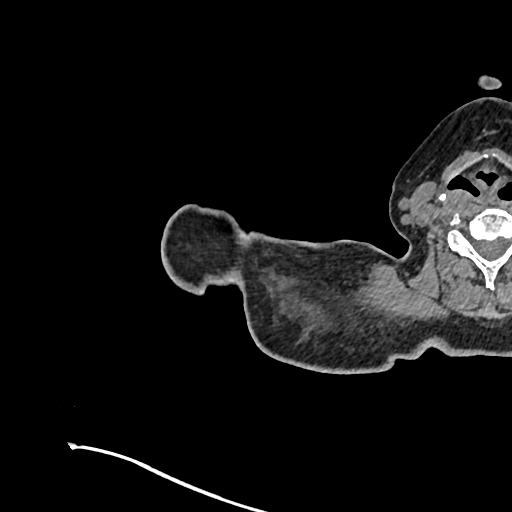
[im 293/326  bone]
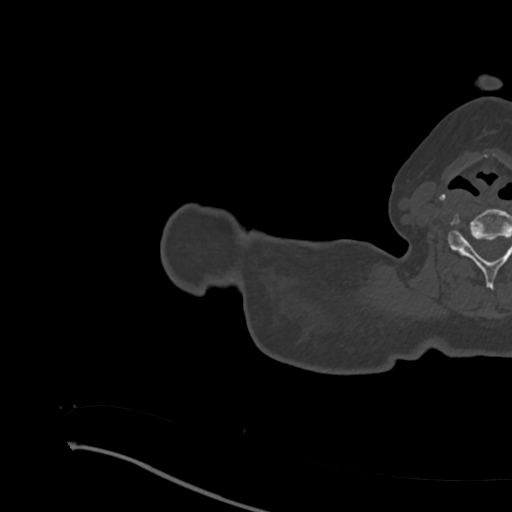

[9 of 14 positions shown; findings below may reference images not displayed]

FINDINGS: Bones/Joint/Cartilage

There is a right proximal humerus fracture through the surgical neck
and greater tuberosity. There is approximately 1.2 cm displacement
of the surgical neck fracture. There is up to up to 8 mm greater
tuberosity displacement at the posterior aspect with mild angulation
preservation of the majority of the humeral head articular surface.
Nondisplaced fracture extension through the lesser tuberosity. There
is no glenoid fracture. Small glenohumeral joint effusion.

Ligaments

Suboptimally assessed by CT.

Muscles and Tendons

No muscle atrophy.  No acute myotendinous abnormality by CT.

Soft tissues

There is soft tissue swelling along the shoulder.
IMPRESSION: Displaced right proximal humerus fracture involving the surgical
neck and greater tuberosity, and nondisplaced involvement of the
lesser tuberosity.

## 2023-06-29 IMAGING — DX DG SHOULDER 1V*R*
1 series · 1 of 1 positions shown · non-contrast
Comparison: January 21, 2022.

CLINICAL DATA: Status post shoulder surgery.

EXAM:
RIGHT SHOULDER - 1 VIEW

[shoulder ap]
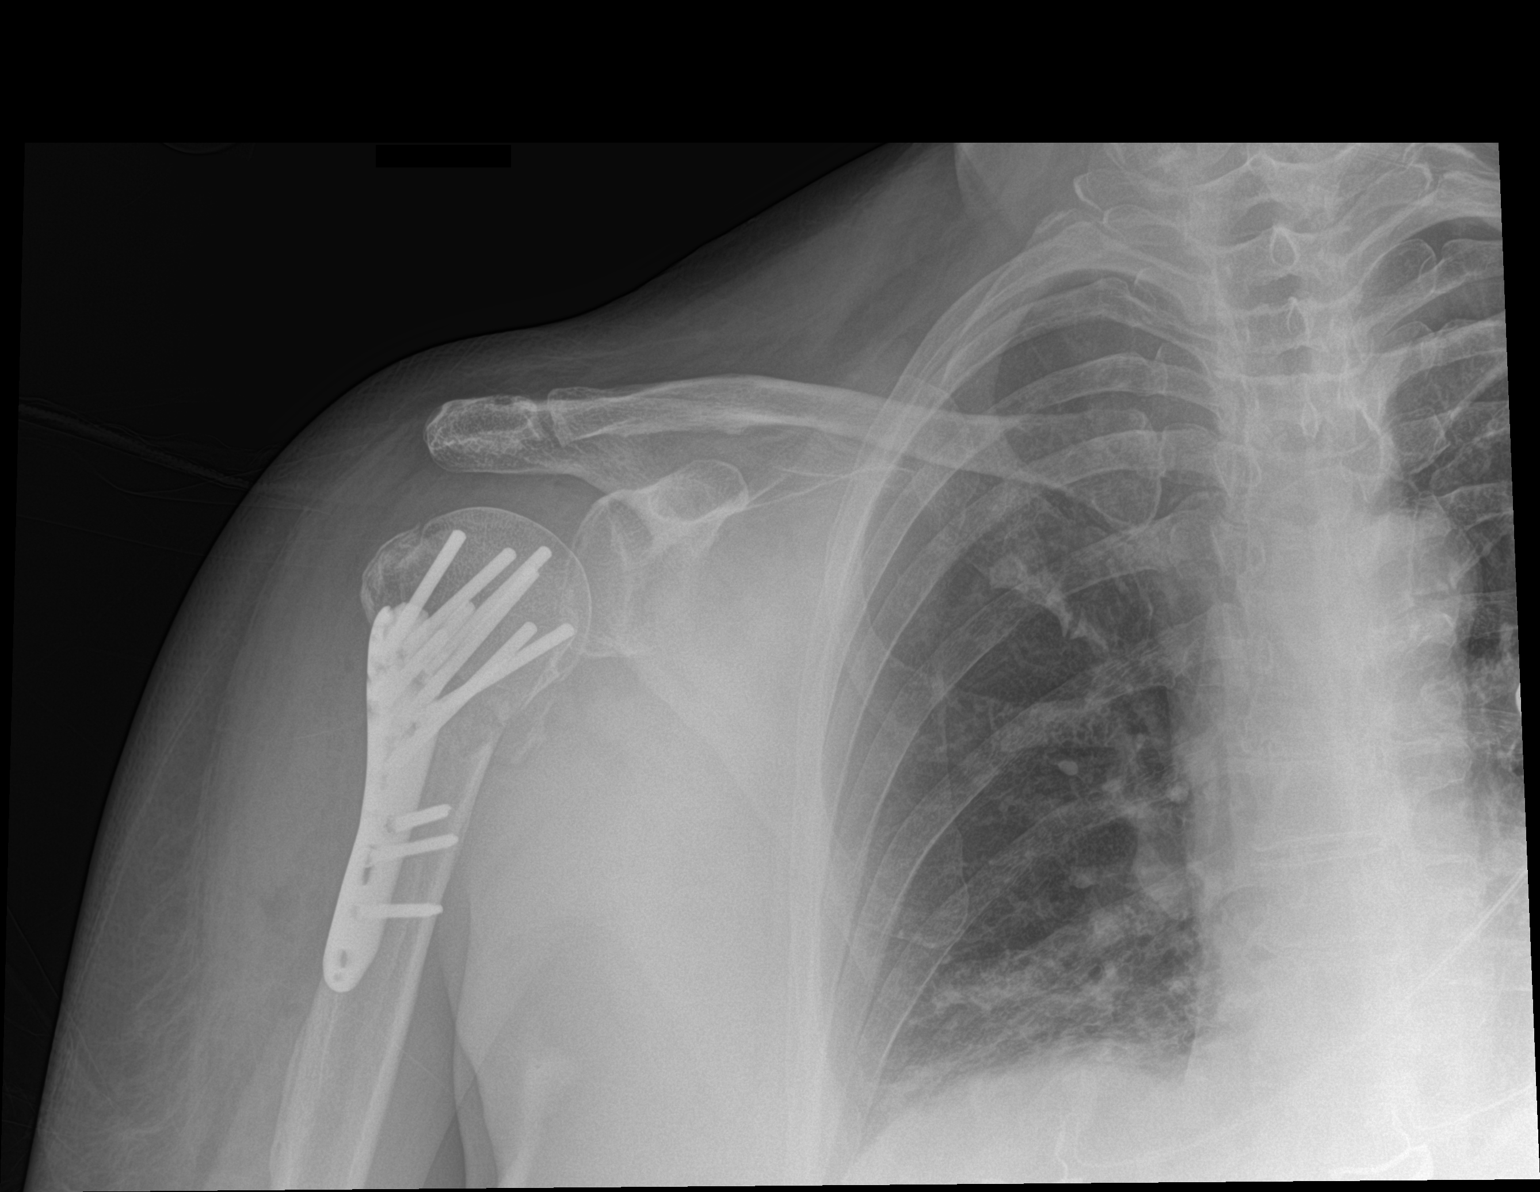

[1 of 1 positions shown; findings below may reference images not displayed]

FINDINGS: Status post surgical internal fixation of proximal right humeral
neck and head fracture. Good alignment of fracture components is
noted.
IMPRESSION: Status post surgical internal fixation of proximal right humeral
fracture.

## 2023-06-29 IMAGING — RF DG SHOULDER 2+V*R*
1 series · 6 of 6 positions shown · non-contrast
Comparison: Right humerus radiographs 01/19/2022

CLINICAL DATA: Operative fluoroscopy for right shoulder ORIF

EXAM:
RIGHT SHOULDER - 2+ VIEW

[Series 1: unknown protocol · 0.20mm/px · 6 of 6 slices shown]
[im 1/6]
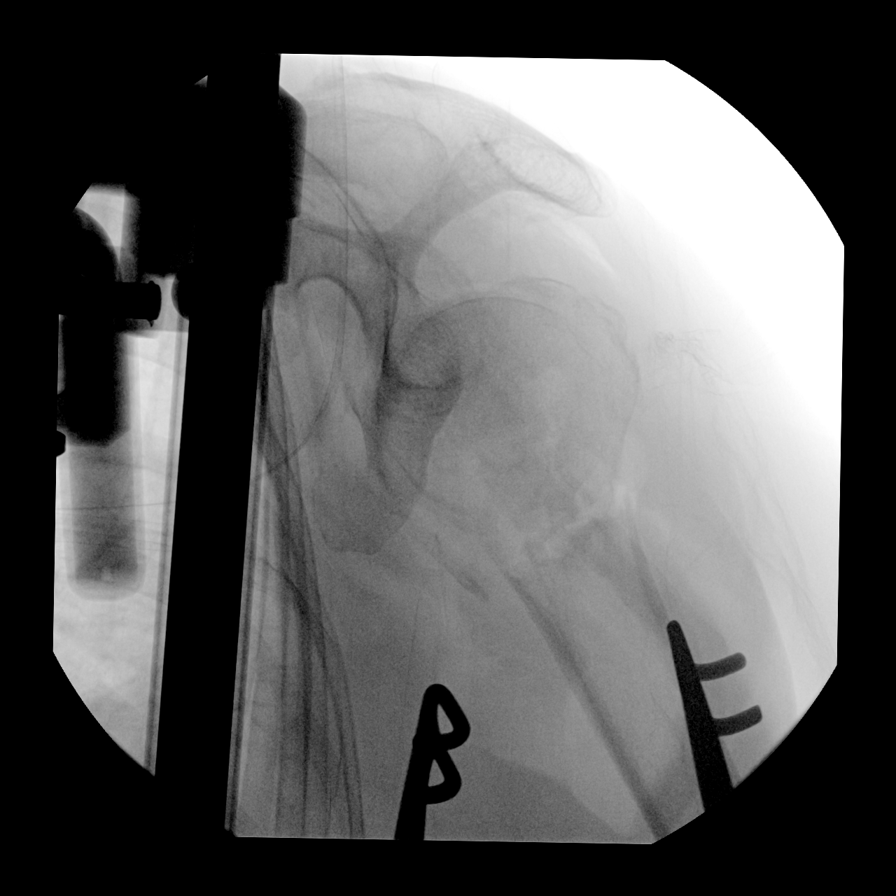
[im 2/6]
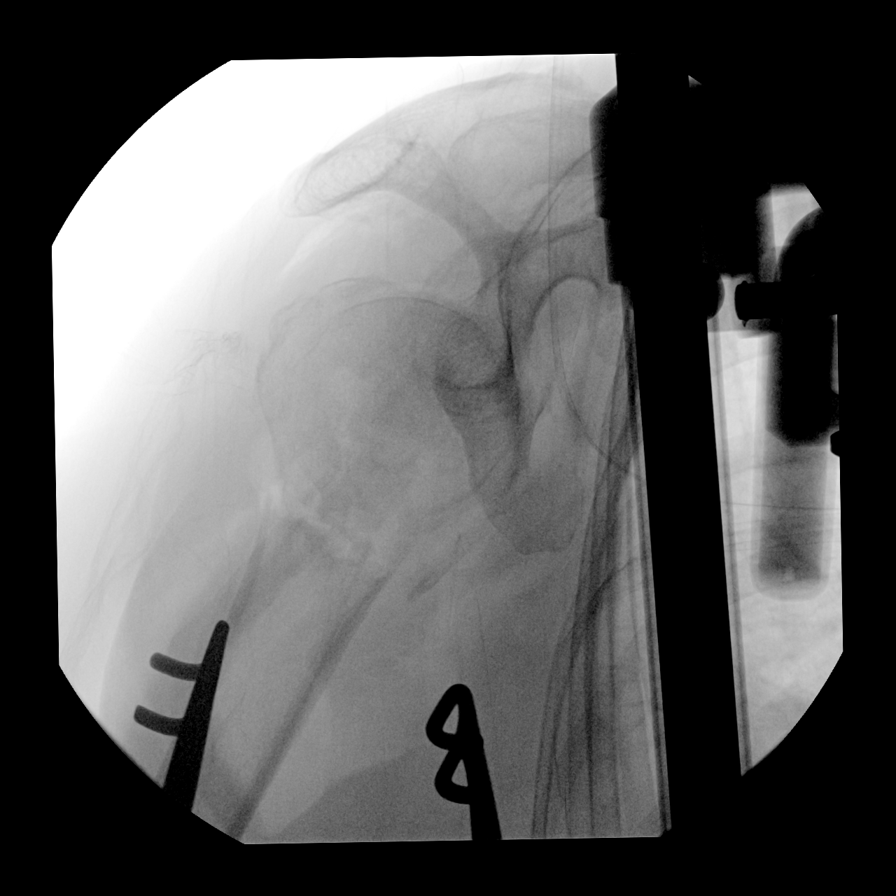
[im 3/6]
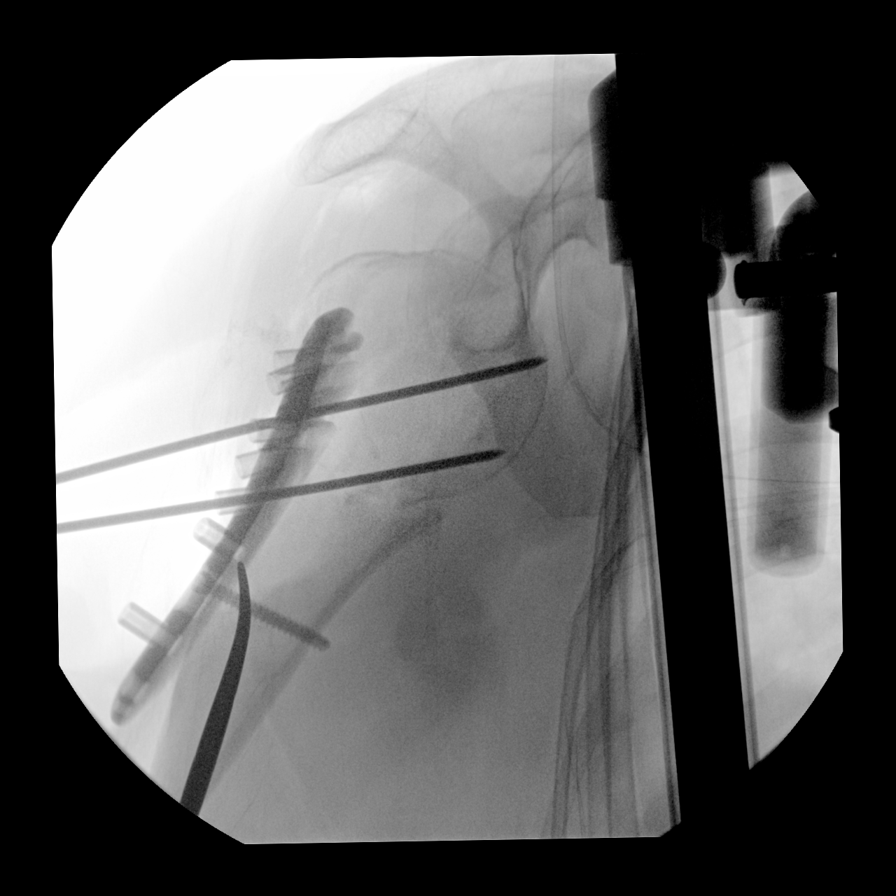
[im 4/6]
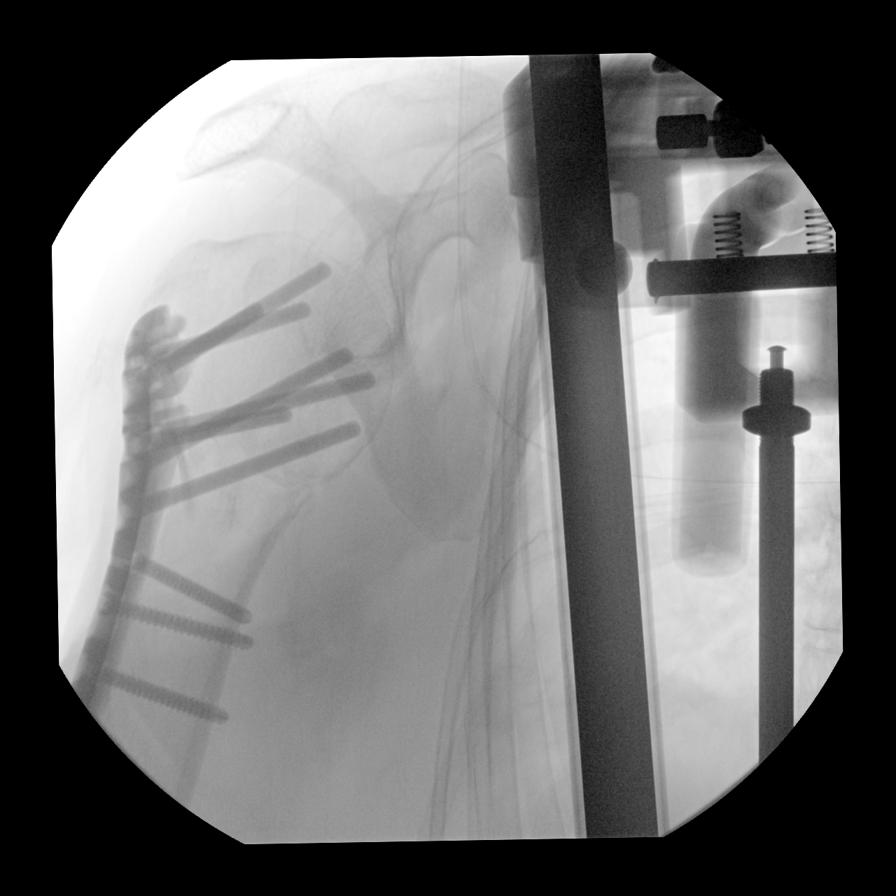
[im 5/6]
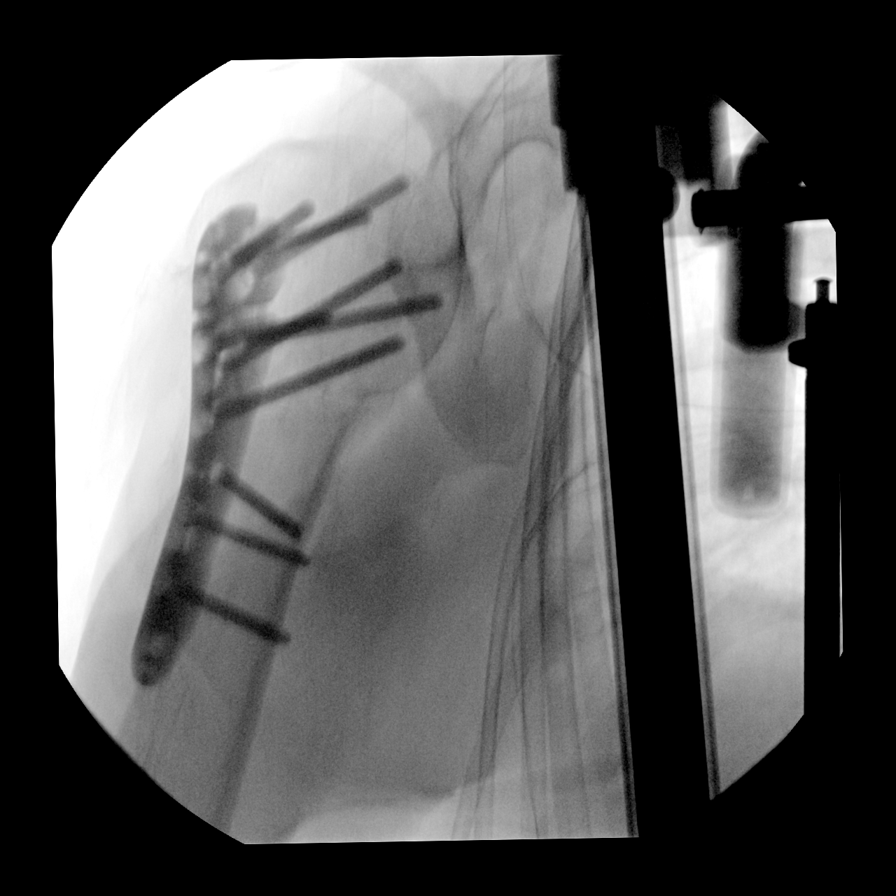
[im 6/6]
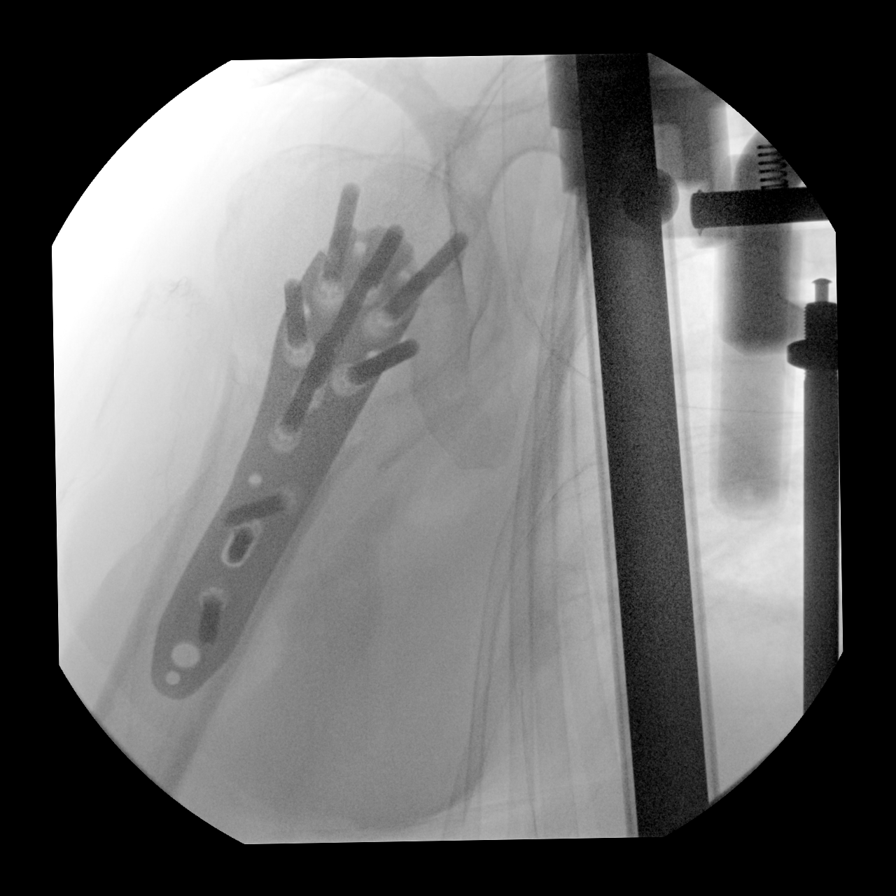

[6 of 6 positions shown; findings below may reference images not displayed]

FINDINGS: Images were performed intraoperatively without the presence of a
radiologist. The patient appears to be undergoing lateral plate and
screw fixation of the previously seen comminuted and displaced
proximal right humeral fracture.

Total fluoroscopy images: 6

Total fluoroscopy time: 72 seconds

Total dose: Radiation Exposure Index (as provided by the
fluoroscopic device): 10.53 mGy air Kerma

Please see intraoperative findings for further detail.
IMPRESSION: Intraoperative fluoro for ORIF of the proximal right humerus.

## 2023-07-01 DIAGNOSIS — J069 Acute upper respiratory infection, unspecified: Secondary | ICD-10-CM | POA: Diagnosis not present

## 2023-07-01 DIAGNOSIS — J029 Acute pharyngitis, unspecified: Secondary | ICD-10-CM | POA: Diagnosis not present

## 2023-08-21 DIAGNOSIS — R54 Age-related physical debility: Secondary | ICD-10-CM | POA: Diagnosis not present

## 2023-08-21 DIAGNOSIS — R2689 Other abnormalities of gait and mobility: Secondary | ICD-10-CM | POA: Diagnosis not present

## 2023-08-21 DIAGNOSIS — I499 Cardiac arrhythmia, unspecified: Secondary | ICD-10-CM | POA: Diagnosis not present

## 2023-08-21 DIAGNOSIS — Z882 Allergy status to sulfonamides status: Secondary | ICD-10-CM | POA: Diagnosis not present

## 2023-08-21 DIAGNOSIS — E876 Hypokalemia: Secondary | ICD-10-CM | POA: Diagnosis not present

## 2023-08-21 DIAGNOSIS — R296 Repeated falls: Secondary | ICD-10-CM | POA: Diagnosis not present

## 2023-08-21 DIAGNOSIS — R531 Weakness: Secondary | ICD-10-CM | POA: Diagnosis not present

## 2023-08-21 DIAGNOSIS — Z9181 History of falling: Secondary | ICD-10-CM | POA: Diagnosis not present

## 2023-08-21 DIAGNOSIS — M170 Bilateral primary osteoarthritis of knee: Secondary | ICD-10-CM | POA: Diagnosis not present

## 2023-08-21 DIAGNOSIS — E785 Hyperlipidemia, unspecified: Secondary | ICD-10-CM | POA: Diagnosis not present

## 2023-08-21 DIAGNOSIS — M4807 Spinal stenosis, lumbosacral region: Secondary | ICD-10-CM | POA: Diagnosis not present

## 2023-08-21 DIAGNOSIS — M810 Age-related osteoporosis without current pathological fracture: Secondary | ICD-10-CM | POA: Diagnosis not present

## 2023-08-21 DIAGNOSIS — Z880 Allergy status to penicillin: Secondary | ICD-10-CM | POA: Diagnosis not present

## 2023-08-21 DIAGNOSIS — R059 Cough, unspecified: Secondary | ICD-10-CM | POA: Diagnosis not present

## 2023-08-21 DIAGNOSIS — Z885 Allergy status to narcotic agent status: Secondary | ICD-10-CM | POA: Diagnosis not present

## 2023-08-21 DIAGNOSIS — N179 Acute kidney failure, unspecified: Secondary | ICD-10-CM | POA: Diagnosis not present

## 2023-08-21 DIAGNOSIS — K529 Noninfective gastroenteritis and colitis, unspecified: Secondary | ICD-10-CM | POA: Diagnosis not present

## 2023-08-21 DIAGNOSIS — M51369 Other intervertebral disc degeneration, lumbar region without mention of lumbar back pain or lower extremity pain: Secondary | ICD-10-CM | POA: Diagnosis not present

## 2023-08-21 DIAGNOSIS — M47816 Spondylosis without myelopathy or radiculopathy, lumbar region: Secondary | ICD-10-CM | POA: Diagnosis not present

## 2023-08-21 DIAGNOSIS — Z7983 Long term (current) use of bisphosphonates: Secondary | ICD-10-CM | POA: Diagnosis not present

## 2023-08-21 DIAGNOSIS — R197 Diarrhea, unspecified: Secondary | ICD-10-CM | POA: Diagnosis not present

## 2023-08-21 DIAGNOSIS — K219 Gastro-esophageal reflux disease without esophagitis: Secondary | ICD-10-CM | POA: Diagnosis not present

## 2023-08-21 DIAGNOSIS — G43909 Migraine, unspecified, not intractable, without status migrainosus: Secondary | ICD-10-CM | POA: Diagnosis not present

## 2023-08-21 DIAGNOSIS — Z20822 Contact with and (suspected) exposure to covid-19: Secondary | ICD-10-CM | POA: Diagnosis not present

## 2023-08-21 DIAGNOSIS — I10 Essential (primary) hypertension: Secondary | ICD-10-CM | POA: Diagnosis not present

## 2023-08-21 DIAGNOSIS — J209 Acute bronchitis, unspecified: Secondary | ICD-10-CM | POA: Diagnosis not present

## 2023-08-21 DIAGNOSIS — Z743 Need for continuous supervision: Secondary | ICD-10-CM | POA: Diagnosis not present

## 2023-08-21 DIAGNOSIS — M51379 Other intervertebral disc degeneration, lumbosacral region without mention of lumbar back pain or lower extremity pain: Secondary | ICD-10-CM | POA: Diagnosis not present

## 2023-08-21 DIAGNOSIS — Z79899 Other long term (current) drug therapy: Secondary | ICD-10-CM | POA: Diagnosis not present

## 2023-08-21 DIAGNOSIS — J40 Bronchitis, not specified as acute or chronic: Secondary | ICD-10-CM | POA: Diagnosis not present

## 2023-08-21 DIAGNOSIS — R918 Other nonspecific abnormal finding of lung field: Secondary | ICD-10-CM | POA: Diagnosis not present

## 2023-08-26 DIAGNOSIS — G43909 Migraine, unspecified, not intractable, without status migrainosus: Secondary | ICD-10-CM | POA: Diagnosis not present

## 2023-08-26 DIAGNOSIS — R296 Repeated falls: Secondary | ICD-10-CM | POA: Diagnosis not present

## 2023-08-26 DIAGNOSIS — M170 Bilateral primary osteoarthritis of knee: Secondary | ICD-10-CM | POA: Diagnosis not present

## 2023-08-26 DIAGNOSIS — R197 Diarrhea, unspecified: Secondary | ICD-10-CM | POA: Diagnosis not present

## 2023-08-26 DIAGNOSIS — E785 Hyperlipidemia, unspecified: Secondary | ICD-10-CM | POA: Diagnosis not present

## 2023-08-26 DIAGNOSIS — M810 Age-related osteoporosis without current pathological fracture: Secondary | ICD-10-CM | POA: Diagnosis not present

## 2023-08-26 DIAGNOSIS — K219 Gastro-esophageal reflux disease without esophagitis: Secondary | ICD-10-CM | POA: Diagnosis not present

## 2023-08-26 DIAGNOSIS — R2689 Other abnormalities of gait and mobility: Secondary | ICD-10-CM | POA: Diagnosis not present

## 2023-08-26 DIAGNOSIS — I10 Essential (primary) hypertension: Secondary | ICD-10-CM | POA: Diagnosis not present

## 2023-08-26 DIAGNOSIS — R531 Weakness: Secondary | ICD-10-CM | POA: Diagnosis not present

## 2023-08-26 DIAGNOSIS — N179 Acute kidney failure, unspecified: Secondary | ICD-10-CM | POA: Diagnosis not present

## 2023-08-26 DIAGNOSIS — J209 Acute bronchitis, unspecified: Secondary | ICD-10-CM | POA: Diagnosis not present

## 2023-08-30 DIAGNOSIS — K219 Gastro-esophageal reflux disease without esophagitis: Secondary | ICD-10-CM | POA: Diagnosis not present

## 2023-08-30 DIAGNOSIS — N179 Acute kidney failure, unspecified: Secondary | ICD-10-CM | POA: Diagnosis not present

## 2023-08-30 DIAGNOSIS — I10 Essential (primary) hypertension: Secondary | ICD-10-CM | POA: Diagnosis not present

## 2023-09-06 DIAGNOSIS — N179 Acute kidney failure, unspecified: Secondary | ICD-10-CM | POA: Diagnosis not present

## 2023-09-06 DIAGNOSIS — K219 Gastro-esophageal reflux disease without esophagitis: Secondary | ICD-10-CM | POA: Diagnosis not present

## 2023-09-06 DIAGNOSIS — I10 Essential (primary) hypertension: Secondary | ICD-10-CM | POA: Diagnosis not present

## 2023-09-16 DIAGNOSIS — E039 Hypothyroidism, unspecified: Secondary | ICD-10-CM | POA: Diagnosis not present

## 2023-09-16 DIAGNOSIS — I10 Essential (primary) hypertension: Secondary | ICD-10-CM | POA: Diagnosis not present

## 2023-09-16 DIAGNOSIS — Z131 Encounter for screening for diabetes mellitus: Secondary | ICD-10-CM | POA: Diagnosis not present

## 2023-09-16 DIAGNOSIS — E7849 Other hyperlipidemia: Secondary | ICD-10-CM | POA: Diagnosis not present

## 2023-09-23 DIAGNOSIS — I10 Essential (primary) hypertension: Secondary | ICD-10-CM | POA: Diagnosis not present

## 2023-09-23 DIAGNOSIS — G43909 Migraine, unspecified, not intractable, without status migrainosus: Secondary | ICD-10-CM | POA: Diagnosis not present

## 2023-09-23 DIAGNOSIS — K219 Gastro-esophageal reflux disease without esophagitis: Secondary | ICD-10-CM | POA: Diagnosis not present

## 2023-09-23 DIAGNOSIS — Z556 Problems related to health literacy: Secondary | ICD-10-CM | POA: Diagnosis not present

## 2023-09-23 DIAGNOSIS — M1990 Unspecified osteoarthritis, unspecified site: Secondary | ICD-10-CM | POA: Diagnosis not present

## 2023-09-23 DIAGNOSIS — E785 Hyperlipidemia, unspecified: Secondary | ICD-10-CM | POA: Diagnosis not present

## 2023-09-25 DIAGNOSIS — M1990 Unspecified osteoarthritis, unspecified site: Secondary | ICD-10-CM | POA: Diagnosis not present

## 2023-09-25 DIAGNOSIS — E785 Hyperlipidemia, unspecified: Secondary | ICD-10-CM | POA: Diagnosis not present

## 2023-09-25 DIAGNOSIS — G43909 Migraine, unspecified, not intractable, without status migrainosus: Secondary | ICD-10-CM | POA: Diagnosis not present

## 2023-09-25 DIAGNOSIS — I10 Essential (primary) hypertension: Secondary | ICD-10-CM | POA: Diagnosis not present

## 2023-09-25 DIAGNOSIS — Z556 Problems related to health literacy: Secondary | ICD-10-CM | POA: Diagnosis not present

## 2023-09-25 DIAGNOSIS — M170 Bilateral primary osteoarthritis of knee: Secondary | ICD-10-CM | POA: Diagnosis not present

## 2023-09-25 DIAGNOSIS — K219 Gastro-esophageal reflux disease without esophagitis: Secondary | ICD-10-CM | POA: Diagnosis not present

## 2023-09-30 DIAGNOSIS — E785 Hyperlipidemia, unspecified: Secondary | ICD-10-CM | POA: Diagnosis not present

## 2023-09-30 DIAGNOSIS — M1990 Unspecified osteoarthritis, unspecified site: Secondary | ICD-10-CM | POA: Diagnosis not present

## 2023-09-30 DIAGNOSIS — G43909 Migraine, unspecified, not intractable, without status migrainosus: Secondary | ICD-10-CM | POA: Diagnosis not present

## 2023-09-30 DIAGNOSIS — M170 Bilateral primary osteoarthritis of knee: Secondary | ICD-10-CM | POA: Diagnosis not present

## 2023-09-30 DIAGNOSIS — Z556 Problems related to health literacy: Secondary | ICD-10-CM | POA: Diagnosis not present

## 2023-09-30 DIAGNOSIS — K219 Gastro-esophageal reflux disease without esophagitis: Secondary | ICD-10-CM | POA: Diagnosis not present

## 2023-09-30 DIAGNOSIS — I10 Essential (primary) hypertension: Secondary | ICD-10-CM | POA: Diagnosis not present

## 2023-10-01 DIAGNOSIS — G43909 Migraine, unspecified, not intractable, without status migrainosus: Secondary | ICD-10-CM | POA: Diagnosis not present

## 2023-10-01 DIAGNOSIS — M170 Bilateral primary osteoarthritis of knee: Secondary | ICD-10-CM | POA: Diagnosis not present

## 2023-10-01 DIAGNOSIS — E785 Hyperlipidemia, unspecified: Secondary | ICD-10-CM | POA: Diagnosis not present

## 2023-10-01 DIAGNOSIS — I10 Essential (primary) hypertension: Secondary | ICD-10-CM | POA: Diagnosis not present

## 2023-10-01 DIAGNOSIS — Z556 Problems related to health literacy: Secondary | ICD-10-CM | POA: Diagnosis not present

## 2023-10-01 DIAGNOSIS — K219 Gastro-esophageal reflux disease without esophagitis: Secondary | ICD-10-CM | POA: Diagnosis not present

## 2023-10-01 DIAGNOSIS — M1990 Unspecified osteoarthritis, unspecified site: Secondary | ICD-10-CM | POA: Diagnosis not present

## 2023-10-02 DIAGNOSIS — G43909 Migraine, unspecified, not intractable, without status migrainosus: Secondary | ICD-10-CM | POA: Diagnosis not present

## 2023-10-02 DIAGNOSIS — Z0001 Encounter for general adult medical examination with abnormal findings: Secondary | ICD-10-CM | POA: Diagnosis not present

## 2023-10-02 DIAGNOSIS — J301 Allergic rhinitis due to pollen: Secondary | ICD-10-CM | POA: Diagnosis not present

## 2023-10-02 DIAGNOSIS — E782 Mixed hyperlipidemia: Secondary | ICD-10-CM | POA: Diagnosis not present

## 2023-10-02 DIAGNOSIS — Z23 Encounter for immunization: Secondary | ICD-10-CM | POA: Diagnosis not present

## 2023-10-02 DIAGNOSIS — E559 Vitamin D deficiency, unspecified: Secondary | ICD-10-CM | POA: Diagnosis not present

## 2023-10-07 DIAGNOSIS — G43909 Migraine, unspecified, not intractable, without status migrainosus: Secondary | ICD-10-CM | POA: Diagnosis not present

## 2023-10-07 DIAGNOSIS — I10 Essential (primary) hypertension: Secondary | ICD-10-CM | POA: Diagnosis not present

## 2023-10-07 DIAGNOSIS — Z556 Problems related to health literacy: Secondary | ICD-10-CM | POA: Diagnosis not present

## 2023-10-07 DIAGNOSIS — M170 Bilateral primary osteoarthritis of knee: Secondary | ICD-10-CM | POA: Diagnosis not present

## 2023-10-07 DIAGNOSIS — K219 Gastro-esophageal reflux disease without esophagitis: Secondary | ICD-10-CM | POA: Diagnosis not present

## 2023-10-07 DIAGNOSIS — M1990 Unspecified osteoarthritis, unspecified site: Secondary | ICD-10-CM | POA: Diagnosis not present

## 2023-10-07 DIAGNOSIS — E785 Hyperlipidemia, unspecified: Secondary | ICD-10-CM | POA: Diagnosis not present

## 2023-10-08 DIAGNOSIS — I10 Essential (primary) hypertension: Secondary | ICD-10-CM | POA: Diagnosis not present

## 2023-10-08 DIAGNOSIS — E7849 Other hyperlipidemia: Secondary | ICD-10-CM | POA: Diagnosis not present

## 2023-10-08 DIAGNOSIS — E039 Hypothyroidism, unspecified: Secondary | ICD-10-CM | POA: Diagnosis not present

## 2023-10-09 DIAGNOSIS — K219 Gastro-esophageal reflux disease without esophagitis: Secondary | ICD-10-CM | POA: Diagnosis not present

## 2023-10-09 DIAGNOSIS — M170 Bilateral primary osteoarthritis of knee: Secondary | ICD-10-CM | POA: Diagnosis not present

## 2023-10-09 DIAGNOSIS — E785 Hyperlipidemia, unspecified: Secondary | ICD-10-CM | POA: Diagnosis not present

## 2023-10-09 DIAGNOSIS — G43909 Migraine, unspecified, not intractable, without status migrainosus: Secondary | ICD-10-CM | POA: Diagnosis not present

## 2023-10-09 DIAGNOSIS — Z556 Problems related to health literacy: Secondary | ICD-10-CM | POA: Diagnosis not present

## 2023-10-09 DIAGNOSIS — M1990 Unspecified osteoarthritis, unspecified site: Secondary | ICD-10-CM | POA: Diagnosis not present

## 2023-10-09 DIAGNOSIS — I10 Essential (primary) hypertension: Secondary | ICD-10-CM | POA: Diagnosis not present

## 2023-10-13 DIAGNOSIS — M170 Bilateral primary osteoarthritis of knee: Secondary | ICD-10-CM | POA: Diagnosis not present

## 2023-10-13 DIAGNOSIS — M1990 Unspecified osteoarthritis, unspecified site: Secondary | ICD-10-CM | POA: Diagnosis not present

## 2023-10-13 DIAGNOSIS — I10 Essential (primary) hypertension: Secondary | ICD-10-CM | POA: Diagnosis not present

## 2023-10-13 DIAGNOSIS — K219 Gastro-esophageal reflux disease without esophagitis: Secondary | ICD-10-CM | POA: Diagnosis not present

## 2023-10-13 DIAGNOSIS — Z556 Problems related to health literacy: Secondary | ICD-10-CM | POA: Diagnosis not present

## 2023-10-13 DIAGNOSIS — E785 Hyperlipidemia, unspecified: Secondary | ICD-10-CM | POA: Diagnosis not present

## 2023-10-13 DIAGNOSIS — G43909 Migraine, unspecified, not intractable, without status migrainosus: Secondary | ICD-10-CM | POA: Diagnosis not present

## 2023-10-22 DIAGNOSIS — M170 Bilateral primary osteoarthritis of knee: Secondary | ICD-10-CM | POA: Diagnosis not present

## 2023-10-22 DIAGNOSIS — K219 Gastro-esophageal reflux disease without esophagitis: Secondary | ICD-10-CM | POA: Diagnosis not present

## 2023-10-22 DIAGNOSIS — I10 Essential (primary) hypertension: Secondary | ICD-10-CM | POA: Diagnosis not present

## 2023-10-22 DIAGNOSIS — Z556 Problems related to health literacy: Secondary | ICD-10-CM | POA: Diagnosis not present

## 2023-10-22 DIAGNOSIS — M1990 Unspecified osteoarthritis, unspecified site: Secondary | ICD-10-CM | POA: Diagnosis not present

## 2023-10-22 DIAGNOSIS — E785 Hyperlipidemia, unspecified: Secondary | ICD-10-CM | POA: Diagnosis not present

## 2023-10-22 DIAGNOSIS — G43909 Migraine, unspecified, not intractable, without status migrainosus: Secondary | ICD-10-CM | POA: Diagnosis not present

## 2023-10-27 DIAGNOSIS — G43909 Migraine, unspecified, not intractable, without status migrainosus: Secondary | ICD-10-CM | POA: Diagnosis not present

## 2023-10-27 DIAGNOSIS — M170 Bilateral primary osteoarthritis of knee: Secondary | ICD-10-CM | POA: Diagnosis not present

## 2023-10-27 DIAGNOSIS — Z556 Problems related to health literacy: Secondary | ICD-10-CM | POA: Diagnosis not present

## 2023-10-27 DIAGNOSIS — M1990 Unspecified osteoarthritis, unspecified site: Secondary | ICD-10-CM | POA: Diagnosis not present

## 2023-10-27 DIAGNOSIS — E785 Hyperlipidemia, unspecified: Secondary | ICD-10-CM | POA: Diagnosis not present

## 2023-10-27 DIAGNOSIS — I10 Essential (primary) hypertension: Secondary | ICD-10-CM | POA: Diagnosis not present

## 2023-10-27 DIAGNOSIS — K219 Gastro-esophageal reflux disease without esophagitis: Secondary | ICD-10-CM | POA: Diagnosis not present

## 2023-10-30 DIAGNOSIS — G43909 Migraine, unspecified, not intractable, without status migrainosus: Secondary | ICD-10-CM | POA: Diagnosis not present

## 2023-10-30 DIAGNOSIS — I10 Essential (primary) hypertension: Secondary | ICD-10-CM | POA: Diagnosis not present

## 2023-10-30 DIAGNOSIS — E785 Hyperlipidemia, unspecified: Secondary | ICD-10-CM | POA: Diagnosis not present

## 2023-10-30 DIAGNOSIS — K219 Gastro-esophageal reflux disease without esophagitis: Secondary | ICD-10-CM | POA: Diagnosis not present

## 2023-10-30 DIAGNOSIS — M170 Bilateral primary osteoarthritis of knee: Secondary | ICD-10-CM | POA: Diagnosis not present

## 2023-10-30 DIAGNOSIS — Z556 Problems related to health literacy: Secondary | ICD-10-CM | POA: Diagnosis not present

## 2023-10-30 DIAGNOSIS — M1990 Unspecified osteoarthritis, unspecified site: Secondary | ICD-10-CM | POA: Diagnosis not present

## 2023-11-03 DIAGNOSIS — G43909 Migraine, unspecified, not intractable, without status migrainosus: Secondary | ICD-10-CM | POA: Diagnosis not present

## 2023-11-03 DIAGNOSIS — I10 Essential (primary) hypertension: Secondary | ICD-10-CM | POA: Diagnosis not present

## 2023-11-03 DIAGNOSIS — K219 Gastro-esophageal reflux disease without esophagitis: Secondary | ICD-10-CM | POA: Diagnosis not present

## 2023-11-03 DIAGNOSIS — E785 Hyperlipidemia, unspecified: Secondary | ICD-10-CM | POA: Diagnosis not present

## 2023-11-03 DIAGNOSIS — Z556 Problems related to health literacy: Secondary | ICD-10-CM | POA: Diagnosis not present

## 2023-11-03 DIAGNOSIS — M1990 Unspecified osteoarthritis, unspecified site: Secondary | ICD-10-CM | POA: Diagnosis not present

## 2023-11-03 DIAGNOSIS — M170 Bilateral primary osteoarthritis of knee: Secondary | ICD-10-CM | POA: Diagnosis not present

## 2023-11-10 DIAGNOSIS — K219 Gastro-esophageal reflux disease without esophagitis: Secondary | ICD-10-CM | POA: Diagnosis not present

## 2023-11-10 DIAGNOSIS — G43909 Migraine, unspecified, not intractable, without status migrainosus: Secondary | ICD-10-CM | POA: Diagnosis not present

## 2023-11-10 DIAGNOSIS — M170 Bilateral primary osteoarthritis of knee: Secondary | ICD-10-CM | POA: Diagnosis not present

## 2023-11-10 DIAGNOSIS — Z556 Problems related to health literacy: Secondary | ICD-10-CM | POA: Diagnosis not present

## 2023-11-10 DIAGNOSIS — E785 Hyperlipidemia, unspecified: Secondary | ICD-10-CM | POA: Diagnosis not present

## 2023-11-10 DIAGNOSIS — M1990 Unspecified osteoarthritis, unspecified site: Secondary | ICD-10-CM | POA: Diagnosis not present

## 2023-11-10 DIAGNOSIS — I10 Essential (primary) hypertension: Secondary | ICD-10-CM | POA: Diagnosis not present

## 2023-11-21 DIAGNOSIS — Z556 Problems related to health literacy: Secondary | ICD-10-CM | POA: Diagnosis not present

## 2023-11-21 DIAGNOSIS — I10 Essential (primary) hypertension: Secondary | ICD-10-CM | POA: Diagnosis not present

## 2023-11-21 DIAGNOSIS — M170 Bilateral primary osteoarthritis of knee: Secondary | ICD-10-CM | POA: Diagnosis not present

## 2023-11-21 DIAGNOSIS — G43909 Migraine, unspecified, not intractable, without status migrainosus: Secondary | ICD-10-CM | POA: Diagnosis not present

## 2023-11-21 DIAGNOSIS — M1990 Unspecified osteoarthritis, unspecified site: Secondary | ICD-10-CM | POA: Diagnosis not present

## 2023-11-21 DIAGNOSIS — E785 Hyperlipidemia, unspecified: Secondary | ICD-10-CM | POA: Diagnosis not present

## 2023-11-21 DIAGNOSIS — K219 Gastro-esophageal reflux disease without esophagitis: Secondary | ICD-10-CM | POA: Diagnosis not present

## 2023-11-24 DIAGNOSIS — Z1231 Encounter for screening mammogram for malignant neoplasm of breast: Secondary | ICD-10-CM | POA: Diagnosis not present

## 2023-12-15 DIAGNOSIS — N6001 Solitary cyst of right breast: Secondary | ICD-10-CM | POA: Diagnosis not present

## 2023-12-15 DIAGNOSIS — R928 Other abnormal and inconclusive findings on diagnostic imaging of breast: Secondary | ICD-10-CM | POA: Diagnosis not present

## 2023-12-15 DIAGNOSIS — N6313 Unspecified lump in the right breast, lower outer quadrant: Secondary | ICD-10-CM | POA: Diagnosis not present

## 2024-01-07 DIAGNOSIS — R0602 Shortness of breath: Secondary | ICD-10-CM | POA: Diagnosis not present

## 2024-01-07 DIAGNOSIS — R531 Weakness: Secondary | ICD-10-CM | POA: Diagnosis not present

## 2024-01-16 DIAGNOSIS — E785 Hyperlipidemia, unspecified: Secondary | ICD-10-CM | POA: Diagnosis not present

## 2024-01-16 DIAGNOSIS — K219 Gastro-esophageal reflux disease without esophagitis: Secondary | ICD-10-CM | POA: Diagnosis not present

## 2024-01-16 DIAGNOSIS — M170 Bilateral primary osteoarthritis of knee: Secondary | ICD-10-CM | POA: Diagnosis not present

## 2024-01-16 DIAGNOSIS — G43909 Migraine, unspecified, not intractable, without status migrainosus: Secondary | ICD-10-CM | POA: Diagnosis not present

## 2024-01-16 DIAGNOSIS — Z9049 Acquired absence of other specified parts of digestive tract: Secondary | ICD-10-CM | POA: Diagnosis not present

## 2024-01-16 DIAGNOSIS — Z7983 Long term (current) use of bisphosphonates: Secondary | ICD-10-CM | POA: Diagnosis not present

## 2024-01-16 DIAGNOSIS — R531 Weakness: Secondary | ICD-10-CM | POA: Diagnosis not present

## 2024-01-16 DIAGNOSIS — I10 Essential (primary) hypertension: Secondary | ICD-10-CM | POA: Diagnosis not present

## 2024-01-16 DIAGNOSIS — E559 Vitamin D deficiency, unspecified: Secondary | ICD-10-CM | POA: Diagnosis not present

## 2024-01-19 DIAGNOSIS — I10 Essential (primary) hypertension: Secondary | ICD-10-CM | POA: Diagnosis not present

## 2024-01-19 DIAGNOSIS — R531 Weakness: Secondary | ICD-10-CM | POA: Diagnosis not present

## 2024-01-19 DIAGNOSIS — Z7983 Long term (current) use of bisphosphonates: Secondary | ICD-10-CM | POA: Diagnosis not present

## 2024-01-19 DIAGNOSIS — E785 Hyperlipidemia, unspecified: Secondary | ICD-10-CM | POA: Diagnosis not present

## 2024-01-19 DIAGNOSIS — E559 Vitamin D deficiency, unspecified: Secondary | ICD-10-CM | POA: Diagnosis not present

## 2024-01-19 DIAGNOSIS — G43909 Migraine, unspecified, not intractable, without status migrainosus: Secondary | ICD-10-CM | POA: Diagnosis not present

## 2024-01-19 DIAGNOSIS — K219 Gastro-esophageal reflux disease without esophagitis: Secondary | ICD-10-CM | POA: Diagnosis not present

## 2024-01-19 DIAGNOSIS — M170 Bilateral primary osteoarthritis of knee: Secondary | ICD-10-CM | POA: Diagnosis not present

## 2024-01-19 DIAGNOSIS — Z9049 Acquired absence of other specified parts of digestive tract: Secondary | ICD-10-CM | POA: Diagnosis not present

## 2024-01-21 DIAGNOSIS — K219 Gastro-esophageal reflux disease without esophagitis: Secondary | ICD-10-CM | POA: Diagnosis not present

## 2024-01-21 DIAGNOSIS — R531 Weakness: Secondary | ICD-10-CM | POA: Diagnosis not present

## 2024-01-21 DIAGNOSIS — E785 Hyperlipidemia, unspecified: Secondary | ICD-10-CM | POA: Diagnosis not present

## 2024-01-21 DIAGNOSIS — Z7983 Long term (current) use of bisphosphonates: Secondary | ICD-10-CM | POA: Diagnosis not present

## 2024-01-21 DIAGNOSIS — M170 Bilateral primary osteoarthritis of knee: Secondary | ICD-10-CM | POA: Diagnosis not present

## 2024-01-21 DIAGNOSIS — I10 Essential (primary) hypertension: Secondary | ICD-10-CM | POA: Diagnosis not present

## 2024-01-21 DIAGNOSIS — G43909 Migraine, unspecified, not intractable, without status migrainosus: Secondary | ICD-10-CM | POA: Diagnosis not present

## 2024-01-21 DIAGNOSIS — Z9049 Acquired absence of other specified parts of digestive tract: Secondary | ICD-10-CM | POA: Diagnosis not present

## 2024-01-21 DIAGNOSIS — E559 Vitamin D deficiency, unspecified: Secondary | ICD-10-CM | POA: Diagnosis not present

## 2024-01-23 DIAGNOSIS — E785 Hyperlipidemia, unspecified: Secondary | ICD-10-CM | POA: Diagnosis not present

## 2024-01-23 DIAGNOSIS — M170 Bilateral primary osteoarthritis of knee: Secondary | ICD-10-CM | POA: Diagnosis not present

## 2024-01-23 DIAGNOSIS — R531 Weakness: Secondary | ICD-10-CM | POA: Diagnosis not present

## 2024-01-23 DIAGNOSIS — I10 Essential (primary) hypertension: Secondary | ICD-10-CM | POA: Diagnosis not present

## 2024-01-23 DIAGNOSIS — Z7983 Long term (current) use of bisphosphonates: Secondary | ICD-10-CM | POA: Diagnosis not present

## 2024-01-23 DIAGNOSIS — Z9049 Acquired absence of other specified parts of digestive tract: Secondary | ICD-10-CM | POA: Diagnosis not present

## 2024-01-23 DIAGNOSIS — E559 Vitamin D deficiency, unspecified: Secondary | ICD-10-CM | POA: Diagnosis not present

## 2024-01-23 DIAGNOSIS — K219 Gastro-esophageal reflux disease without esophagitis: Secondary | ICD-10-CM | POA: Diagnosis not present

## 2024-01-23 DIAGNOSIS — G43909 Migraine, unspecified, not intractable, without status migrainosus: Secondary | ICD-10-CM | POA: Diagnosis not present

## 2024-01-27 DIAGNOSIS — E7849 Other hyperlipidemia: Secondary | ICD-10-CM | POA: Diagnosis not present

## 2024-01-27 DIAGNOSIS — G43909 Migraine, unspecified, not intractable, without status migrainosus: Secondary | ICD-10-CM | POA: Diagnosis not present

## 2024-01-27 DIAGNOSIS — Z9049 Acquired absence of other specified parts of digestive tract: Secondary | ICD-10-CM | POA: Diagnosis not present

## 2024-01-27 DIAGNOSIS — K219 Gastro-esophageal reflux disease without esophagitis: Secondary | ICD-10-CM | POA: Diagnosis not present

## 2024-01-27 DIAGNOSIS — Z131 Encounter for screening for diabetes mellitus: Secondary | ICD-10-CM | POA: Diagnosis not present

## 2024-01-27 DIAGNOSIS — E785 Hyperlipidemia, unspecified: Secondary | ICD-10-CM | POA: Diagnosis not present

## 2024-01-27 DIAGNOSIS — M170 Bilateral primary osteoarthritis of knee: Secondary | ICD-10-CM | POA: Diagnosis not present

## 2024-01-27 DIAGNOSIS — I10 Essential (primary) hypertension: Secondary | ICD-10-CM | POA: Diagnosis not present

## 2024-01-27 DIAGNOSIS — E559 Vitamin D deficiency, unspecified: Secondary | ICD-10-CM | POA: Diagnosis not present

## 2024-01-27 DIAGNOSIS — Z7983 Long term (current) use of bisphosphonates: Secondary | ICD-10-CM | POA: Diagnosis not present

## 2024-01-27 DIAGNOSIS — R300 Dysuria: Secondary | ICD-10-CM | POA: Diagnosis not present

## 2024-01-27 DIAGNOSIS — R531 Weakness: Secondary | ICD-10-CM | POA: Diagnosis not present

## 2024-01-29 DIAGNOSIS — R531 Weakness: Secondary | ICD-10-CM | POA: Diagnosis not present

## 2024-01-29 DIAGNOSIS — Z7983 Long term (current) use of bisphosphonates: Secondary | ICD-10-CM | POA: Diagnosis not present

## 2024-01-29 DIAGNOSIS — I10 Essential (primary) hypertension: Secondary | ICD-10-CM | POA: Diagnosis not present

## 2024-01-29 DIAGNOSIS — Z9049 Acquired absence of other specified parts of digestive tract: Secondary | ICD-10-CM | POA: Diagnosis not present

## 2024-01-29 DIAGNOSIS — K219 Gastro-esophageal reflux disease without esophagitis: Secondary | ICD-10-CM | POA: Diagnosis not present

## 2024-01-29 DIAGNOSIS — E559 Vitamin D deficiency, unspecified: Secondary | ICD-10-CM | POA: Diagnosis not present

## 2024-01-29 DIAGNOSIS — G43909 Migraine, unspecified, not intractable, without status migrainosus: Secondary | ICD-10-CM | POA: Diagnosis not present

## 2024-01-29 DIAGNOSIS — M170 Bilateral primary osteoarthritis of knee: Secondary | ICD-10-CM | POA: Diagnosis not present

## 2024-01-29 DIAGNOSIS — E785 Hyperlipidemia, unspecified: Secondary | ICD-10-CM | POA: Diagnosis not present

## 2024-02-03 DIAGNOSIS — Z7983 Long term (current) use of bisphosphonates: Secondary | ICD-10-CM | POA: Diagnosis not present

## 2024-02-03 DIAGNOSIS — R531 Weakness: Secondary | ICD-10-CM | POA: Diagnosis not present

## 2024-02-03 DIAGNOSIS — K219 Gastro-esophageal reflux disease without esophagitis: Secondary | ICD-10-CM | POA: Diagnosis not present

## 2024-02-03 DIAGNOSIS — E785 Hyperlipidemia, unspecified: Secondary | ICD-10-CM | POA: Diagnosis not present

## 2024-02-03 DIAGNOSIS — M170 Bilateral primary osteoarthritis of knee: Secondary | ICD-10-CM | POA: Diagnosis not present

## 2024-02-03 DIAGNOSIS — G43909 Migraine, unspecified, not intractable, without status migrainosus: Secondary | ICD-10-CM | POA: Diagnosis not present

## 2024-02-03 DIAGNOSIS — I10 Essential (primary) hypertension: Secondary | ICD-10-CM | POA: Diagnosis not present

## 2024-02-03 DIAGNOSIS — Z9049 Acquired absence of other specified parts of digestive tract: Secondary | ICD-10-CM | POA: Diagnosis not present

## 2024-02-03 DIAGNOSIS — E559 Vitamin D deficiency, unspecified: Secondary | ICD-10-CM | POA: Diagnosis not present

## 2024-02-05 DIAGNOSIS — G43909 Migraine, unspecified, not intractable, without status migrainosus: Secondary | ICD-10-CM | POA: Diagnosis not present

## 2024-02-05 DIAGNOSIS — J301 Allergic rhinitis due to pollen: Secondary | ICD-10-CM | POA: Diagnosis not present

## 2024-02-05 DIAGNOSIS — E782 Mixed hyperlipidemia: Secondary | ICD-10-CM | POA: Diagnosis not present

## 2024-02-05 DIAGNOSIS — E559 Vitamin D deficiency, unspecified: Secondary | ICD-10-CM | POA: Diagnosis not present

## 2024-02-11 DIAGNOSIS — R531 Weakness: Secondary | ICD-10-CM | POA: Diagnosis not present

## 2024-02-11 DIAGNOSIS — M170 Bilateral primary osteoarthritis of knee: Secondary | ICD-10-CM | POA: Diagnosis not present

## 2024-02-11 DIAGNOSIS — G43909 Migraine, unspecified, not intractable, without status migrainosus: Secondary | ICD-10-CM | POA: Diagnosis not present

## 2024-02-11 DIAGNOSIS — E785 Hyperlipidemia, unspecified: Secondary | ICD-10-CM | POA: Diagnosis not present

## 2024-02-11 DIAGNOSIS — Z7983 Long term (current) use of bisphosphonates: Secondary | ICD-10-CM | POA: Diagnosis not present

## 2024-02-11 DIAGNOSIS — E559 Vitamin D deficiency, unspecified: Secondary | ICD-10-CM | POA: Diagnosis not present

## 2024-02-11 DIAGNOSIS — Z9049 Acquired absence of other specified parts of digestive tract: Secondary | ICD-10-CM | POA: Diagnosis not present

## 2024-02-11 DIAGNOSIS — I10 Essential (primary) hypertension: Secondary | ICD-10-CM | POA: Diagnosis not present

## 2024-02-11 DIAGNOSIS — K219 Gastro-esophageal reflux disease without esophagitis: Secondary | ICD-10-CM | POA: Diagnosis not present

## 2024-02-12 DIAGNOSIS — I10 Essential (primary) hypertension: Secondary | ICD-10-CM | POA: Diagnosis not present

## 2024-02-12 DIAGNOSIS — Z9049 Acquired absence of other specified parts of digestive tract: Secondary | ICD-10-CM | POA: Diagnosis not present

## 2024-02-12 DIAGNOSIS — Z7983 Long term (current) use of bisphosphonates: Secondary | ICD-10-CM | POA: Diagnosis not present

## 2024-02-12 DIAGNOSIS — E559 Vitamin D deficiency, unspecified: Secondary | ICD-10-CM | POA: Diagnosis not present

## 2024-02-12 DIAGNOSIS — G43909 Migraine, unspecified, not intractable, without status migrainosus: Secondary | ICD-10-CM | POA: Diagnosis not present

## 2024-02-12 DIAGNOSIS — R531 Weakness: Secondary | ICD-10-CM | POA: Diagnosis not present

## 2024-02-12 DIAGNOSIS — M170 Bilateral primary osteoarthritis of knee: Secondary | ICD-10-CM | POA: Diagnosis not present

## 2024-02-12 DIAGNOSIS — E785 Hyperlipidemia, unspecified: Secondary | ICD-10-CM | POA: Diagnosis not present

## 2024-02-12 DIAGNOSIS — K219 Gastro-esophageal reflux disease without esophagitis: Secondary | ICD-10-CM | POA: Diagnosis not present

## 2024-02-16 DIAGNOSIS — I10 Essential (primary) hypertension: Secondary | ICD-10-CM | POA: Diagnosis not present

## 2024-02-16 DIAGNOSIS — E559 Vitamin D deficiency, unspecified: Secondary | ICD-10-CM | POA: Diagnosis not present

## 2024-02-16 DIAGNOSIS — Z7983 Long term (current) use of bisphosphonates: Secondary | ICD-10-CM | POA: Diagnosis not present

## 2024-02-16 DIAGNOSIS — E785 Hyperlipidemia, unspecified: Secondary | ICD-10-CM | POA: Diagnosis not present

## 2024-02-16 DIAGNOSIS — R531 Weakness: Secondary | ICD-10-CM | POA: Diagnosis not present

## 2024-02-16 DIAGNOSIS — Z9049 Acquired absence of other specified parts of digestive tract: Secondary | ICD-10-CM | POA: Diagnosis not present

## 2024-02-16 DIAGNOSIS — G43909 Migraine, unspecified, not intractable, without status migrainosus: Secondary | ICD-10-CM | POA: Diagnosis not present

## 2024-02-16 DIAGNOSIS — K219 Gastro-esophageal reflux disease without esophagitis: Secondary | ICD-10-CM | POA: Diagnosis not present

## 2024-02-16 DIAGNOSIS — M170 Bilateral primary osteoarthritis of knee: Secondary | ICD-10-CM | POA: Diagnosis not present

## 2024-02-26 DIAGNOSIS — R531 Weakness: Secondary | ICD-10-CM | POA: Diagnosis not present

## 2024-02-26 DIAGNOSIS — K219 Gastro-esophageal reflux disease without esophagitis: Secondary | ICD-10-CM | POA: Diagnosis not present

## 2024-02-26 DIAGNOSIS — Z7983 Long term (current) use of bisphosphonates: Secondary | ICD-10-CM | POA: Diagnosis not present

## 2024-02-26 DIAGNOSIS — E559 Vitamin D deficiency, unspecified: Secondary | ICD-10-CM | POA: Diagnosis not present

## 2024-02-26 DIAGNOSIS — M170 Bilateral primary osteoarthritis of knee: Secondary | ICD-10-CM | POA: Diagnosis not present

## 2024-02-26 DIAGNOSIS — G43909 Migraine, unspecified, not intractable, without status migrainosus: Secondary | ICD-10-CM | POA: Diagnosis not present

## 2024-02-26 DIAGNOSIS — E785 Hyperlipidemia, unspecified: Secondary | ICD-10-CM | POA: Diagnosis not present

## 2024-02-26 DIAGNOSIS — Z9049 Acquired absence of other specified parts of digestive tract: Secondary | ICD-10-CM | POA: Diagnosis not present

## 2024-02-26 DIAGNOSIS — I10 Essential (primary) hypertension: Secondary | ICD-10-CM | POA: Diagnosis not present

## 2024-03-02 DIAGNOSIS — E559 Vitamin D deficiency, unspecified: Secondary | ICD-10-CM | POA: Diagnosis not present

## 2024-03-02 DIAGNOSIS — Z7983 Long term (current) use of bisphosphonates: Secondary | ICD-10-CM | POA: Diagnosis not present

## 2024-03-02 DIAGNOSIS — G43909 Migraine, unspecified, not intractable, without status migrainosus: Secondary | ICD-10-CM | POA: Diagnosis not present

## 2024-03-02 DIAGNOSIS — I10 Essential (primary) hypertension: Secondary | ICD-10-CM | POA: Diagnosis not present

## 2024-03-02 DIAGNOSIS — R531 Weakness: Secondary | ICD-10-CM | POA: Diagnosis not present

## 2024-03-02 DIAGNOSIS — M170 Bilateral primary osteoarthritis of knee: Secondary | ICD-10-CM | POA: Diagnosis not present

## 2024-03-02 DIAGNOSIS — K219 Gastro-esophageal reflux disease without esophagitis: Secondary | ICD-10-CM | POA: Diagnosis not present

## 2024-03-02 DIAGNOSIS — E785 Hyperlipidemia, unspecified: Secondary | ICD-10-CM | POA: Diagnosis not present

## 2024-03-02 DIAGNOSIS — Z9049 Acquired absence of other specified parts of digestive tract: Secondary | ICD-10-CM | POA: Diagnosis not present

## 2024-03-11 DIAGNOSIS — M170 Bilateral primary osteoarthritis of knee: Secondary | ICD-10-CM | POA: Diagnosis not present

## 2024-03-11 DIAGNOSIS — I10 Essential (primary) hypertension: Secondary | ICD-10-CM | POA: Diagnosis not present

## 2024-03-11 DIAGNOSIS — Z9049 Acquired absence of other specified parts of digestive tract: Secondary | ICD-10-CM | POA: Diagnosis not present

## 2024-03-11 DIAGNOSIS — E559 Vitamin D deficiency, unspecified: Secondary | ICD-10-CM | POA: Diagnosis not present

## 2024-03-11 DIAGNOSIS — K219 Gastro-esophageal reflux disease without esophagitis: Secondary | ICD-10-CM | POA: Diagnosis not present

## 2024-03-11 DIAGNOSIS — R531 Weakness: Secondary | ICD-10-CM | POA: Diagnosis not present

## 2024-03-11 DIAGNOSIS — E785 Hyperlipidemia, unspecified: Secondary | ICD-10-CM | POA: Diagnosis not present

## 2024-03-11 DIAGNOSIS — Z7983 Long term (current) use of bisphosphonates: Secondary | ICD-10-CM | POA: Diagnosis not present

## 2024-03-11 DIAGNOSIS — G43909 Migraine, unspecified, not intractable, without status migrainosus: Secondary | ICD-10-CM | POA: Diagnosis not present

## 2024-03-15 ENCOUNTER — Ambulatory Visit: Admitting: Internal Medicine

## 2024-03-16 DIAGNOSIS — I10 Essential (primary) hypertension: Secondary | ICD-10-CM | POA: Diagnosis not present

## 2024-03-16 DIAGNOSIS — E782 Mixed hyperlipidemia: Secondary | ICD-10-CM | POA: Diagnosis not present

## 2024-03-16 DIAGNOSIS — M170 Bilateral primary osteoarthritis of knee: Secondary | ICD-10-CM | POA: Diagnosis not present

## 2024-04-02 ENCOUNTER — Telehealth: Payer: Self-pay

## 2024-04-02 NOTE — Telephone Encounter (Signed)
 Patient need appt with Megan, called patient but the call was disconnected shortly she answered call.

## 2024-04-12 DIAGNOSIS — K219 Gastro-esophageal reflux disease without esophagitis: Secondary | ICD-10-CM | POA: Diagnosis not present

## 2024-04-12 DIAGNOSIS — G43909 Migraine, unspecified, not intractable, without status migrainosus: Secondary | ICD-10-CM | POA: Diagnosis not present

## 2024-04-12 DIAGNOSIS — M170 Bilateral primary osteoarthritis of knee: Secondary | ICD-10-CM | POA: Diagnosis not present

## 2024-04-12 DIAGNOSIS — I10 Essential (primary) hypertension: Secondary | ICD-10-CM | POA: Diagnosis not present

## 2024-04-12 DIAGNOSIS — Z780 Asymptomatic menopausal state: Secondary | ICD-10-CM | POA: Diagnosis not present

## 2024-04-12 DIAGNOSIS — E039 Hypothyroidism, unspecified: Secondary | ICD-10-CM | POA: Diagnosis not present

## 2024-04-12 DIAGNOSIS — J301 Allergic rhinitis due to pollen: Secondary | ICD-10-CM | POA: Diagnosis not present

## 2024-04-12 DIAGNOSIS — E782 Mixed hyperlipidemia: Secondary | ICD-10-CM | POA: Diagnosis not present

## 2024-04-12 DIAGNOSIS — H6122 Impacted cerumen, left ear: Secondary | ICD-10-CM | POA: Diagnosis not present

## 2024-04-12 DIAGNOSIS — M5136 Other intervertebral disc degeneration, lumbar region with discogenic back pain only: Secondary | ICD-10-CM | POA: Diagnosis not present

## 2024-04-15 ENCOUNTER — Encounter: Payer: Self-pay | Admitting: Physical Medicine and Rehabilitation

## 2024-04-15 ENCOUNTER — Ambulatory Visit: Admitting: Physical Medicine and Rehabilitation

## 2024-04-15 DIAGNOSIS — G8929 Other chronic pain: Secondary | ICD-10-CM

## 2024-04-15 DIAGNOSIS — M545 Low back pain, unspecified: Secondary | ICD-10-CM | POA: Diagnosis not present

## 2024-04-15 DIAGNOSIS — M546 Pain in thoracic spine: Secondary | ICD-10-CM | POA: Diagnosis not present

## 2024-04-15 DIAGNOSIS — M47817 Spondylosis without myelopathy or radiculopathy, lumbosacral region: Secondary | ICD-10-CM | POA: Diagnosis not present

## 2024-04-15 DIAGNOSIS — M47816 Spondylosis without myelopathy or radiculopathy, lumbar region: Secondary | ICD-10-CM | POA: Diagnosis not present

## 2024-04-15 NOTE — Progress Notes (Signed)
 Pain Scale   Average Pain 9 Patient advising she has lower back pain that radiates to her middle back, patient advised that this is the same pain and same area that she has years ago and got an injection (2018)        +Driver, -BT, -Dye Allergies.

## 2024-04-15 NOTE — Progress Notes (Signed)
 Amanda Bowman - 78 y.o. female MRN [id number]  Date of birth: 1946-03-20  Office Visit Note: Visit Date: 04/15/2024 PCP: Toribio Jerel MATSU, MD Referred by: Toribio Jerel MATSU, MD  Subjective: Chief Complaint  Patient presents with   Lower Back - Pain   HPI: Amanda Bowman is a 78 y.o. female who comes in today for evaluation of chronic, worsening and severe bilateral lower back pain, intermittent pain radiating up to thoracic back. Pain ongoing for several years, worsens with standing and performing household tasks. She describes pain as dull and aching sensation, currently rates as 8 out of 10. Some relief of pain with home exercise regimen, rest and use of medications. She is taking gabapentin twice daily. No history of dedicated physical therapy for her lower back. Lumbar MRI imaging from 2022 shows facet arthropathy and moderate right sided foraminal stenosis at L4-L5. No high grade spinal canal stenosis noted. She underwent bilateral L4 transforaminal epidural steroid injection in our office on 05/21/2017. She reports significant relief of pain, greater than 80% relief of pain for several years. Patient lives at home with her sister, she does use rolling walker to assist with ambulation. Patient denies recent trauma or falls.      Review of Systems  Musculoskeletal:  Positive for back pain.  Neurological:  Negative for tingling, sensory change, focal weakness and weakness.  All other systems reviewed and are negative.  Otherwise per HPI.  Assessment & Plan: Visit Diagnoses:    ICD-10-CM   1. Chronic bilateral low back pain without sciatica  M54.50 Ambulatory referral to Physical Medicine Rehab   G89.29     2. Spondylosis without myelopathy or radiculopathy, lumbosacral region  M47.817 Ambulatory referral to Physical Medicine Rehab    3. Facet arthropathy, lumbar  M47.816 Ambulatory referral to Physical Medicine Rehab    4. Chronic bilateral thoracic back pain  M54.6  Ambulatory referral to Physical Medicine Rehab   [phone]        Plan: Findings:  Chronic, worsening and severe bilateral lower back pain, intermittent pain radiating up to thoracic back. No radicular pain down the legs.  Patient continues to have severe pain despite good conservative therapy such as home exercise regimen, rest and use of medications.  Patient's clinical presentation and exam are consistent with facet mediated pain.  There is facet arthropathy noted at the level of L4-L5.  Next step is to perform diagnostic and hopefully therapeutic bilateral L4-L5 facet joint injection under fluoroscopic guidance. If good relief of pain with injection we can repeat this procedure infrequently as needed. I discussed injection procedure with patient in detail today, she has no questions at this time. Should her pain declare itself as more radicular would consider repeating lumbar epidural steroid injection. No red flag symptoms noted upon exam today.     Meds & Orders: No orders of the defined types were placed in this encounter.   Orders Placed This Encounter  Procedures   Ambulatory referral to Physical Medicine Rehab    Follow-up: Return for Bilateral L4-L5 facet joint injections.   Procedures: No procedures performed      Clinical History: MRI LUMBAR SPINE WITHOUT CONTRAST   TECHNIQUE: Multiplanar, multisequence MR imaging of the lumbar spine was performed. No intravenous contrast was administered.   COMPARISON:  None.   FINDINGS: Segmentation:  Standard.   Alignment:  2 mm retrolisthesis of L4 on L5.   Vertebrae: No acute fracture, evidence of discitis, or aggressive bone lesion.  Conus medullaris and cauda equina: Conus extends above T12. Conus and cauda equina appear normal.   Paraspinal and other soft tissues: No acute paraspinal abnormality.   Disc levels:   Disc spaces: Degenerative disease with disc height loss at L4-5. Disc desiccation at L3-4 and L5-S1.    T12-L1: No significant disc bulge. No neural foraminal stenosis. No central canal stenosis.   L1-L2: No significant disc bulge. No neural foraminal stenosis. No central canal stenosis.   L2-L3: No significant disc bulge. No neural foraminal stenosis. No central canal stenosis.   L3-L4: Mild broad-based disc bulge. Right perineural cyst. No foraminal or central canal stenosis.   L4-L5: Broad-based disc bulge. Mild bilateral facet arthropathy. Moderate right foraminal stenosis. No left foraminal stenosis. No spinal stenosis. Bilateral lateral recess stenosis.   L5-S1: No significant disc bulge. No neural foraminal stenosis. No central canal stenosis. Left perineural cyst.   IMPRESSION: 1.  No acute osseous injury of the lumbar spine. 2. Lower lumbar spine spondylosis as described above.     Electronically Signed   By: Julaine Blanch M.D.   On: 07/21/2021 15:04   She reports that she has never smoked. She has never used smokeless tobacco. No results for input(s): HGBA1C, LABURIC in the last 8760 hours.  Objective:  VS:  HT:    WT:   BMI:     BP:   HR: bpm  TEMP: ( )  RESP:  Physical Exam Vitals and nursing note reviewed.  HENT:     Head: Normocephalic and atraumatic.     Right Ear: External ear normal.     Left Ear: External ear normal.     Nose: Nose normal.     Mouth/Throat:     Mouth: Mucous membranes are moist.  Eyes:     Extraocular Movements: Extraocular movements intact.  Cardiovascular:     Rate and Rhythm: Normal rate.     Pulses: Normal pulses.  Pulmonary:     Effort: Pulmonary effort is normal.  Abdominal:     General: Abdomen is flat. There is no distension.  Musculoskeletal:        General: Tenderness present.     Cervical back: Normal range of motion.     Comments: Patient is slow to rise from seated position to standing. Pain noted with facet loading and lumbar extension. 5/5 strength noted with bilateral hip flexion, knee flexion/extension,  ankle dorsiflexion/plantarflexion and EHL. No clonus noted bilaterally. No pain upon palpation of greater trochanters. No pain with internal/external rotation of bilateral hips. Sensation intact bilaterally. Negative slump test bilaterally. Ambulates with rolling walker, gait slow and unsteady.  Skin:    General: Skin is warm and dry.     Capillary Refill: Capillary refill takes less than 2 seconds.  Neurological:     General: No focal deficit present.     Mental Status: She is alert and oriented to person, place, and time.  Psychiatric:        Mood and Affect: Mood normal.        Behavior: Behavior normal.     Ortho Exam  Imaging: No results found.  Past Medical/Family/Surgical/Social History: Medications & Allergies reviewed per EMR, new medications updated. Patient Active Problem List   Diagnosis Date Noted   Bilateral primary osteoarthritis of knee 05/18/2019   Cervicalgia 12/30/2018   Acute pain of left shoulder 12/30/2018   Generalized anxiety disorder 12/28/2015   GERD (gastroesophageal reflux disease) 12/28/2015   Essential hypertension 12/28/2015   Past Medical  History:  Diagnosis Date   Anxiety    GERD (gastroesophageal reflux disease)    Hypertension    Hypothyroidism    Migraine    Family History  Problem Relation Age of Onset   Atrial fibrillation Mother    Asthma Sister    Hyperlipidemia Sister    Past Surgical History:  Procedure Laterality Date   ANKLE FRACTURE SURGERY Right    APPENDECTOMY     CHOLECYSTECTOMY     ORIF HUMERUS FRACTURE Right 01/25/2022   Procedure: OPEN REDUCTION INTERNAL FIXATION (ORIF) PROXIMAL HUMERUS FRACTURE RIGHT;  Surgeon: Josefina Chew, MD;  Location: [facility];  Service: Orthopedics;  Laterality: Right;   TONSILLECTOMY     TUBAL LIGATION     Social History   Occupational History   Not on file  Tobacco Use   Smoking status: Never   Smokeless tobacco: Never  Vaping Use   Vaping status: Never Used  Substance and Sexual  Activity   Alcohol use: No    Alcohol/week: 0.0 standard drinks of alcohol   Drug use: No   Sexual activity: Not on file

## 2024-04-27 ENCOUNTER — Encounter: Payer: Self-pay | Admitting: Internal Medicine

## 2024-04-27 ENCOUNTER — Ambulatory Visit: Attending: Internal Medicine | Admitting: Internal Medicine

## 2024-04-27 VITALS — BP 134/86 | HR 84 | Ht 64.0 in | Wt 225.2 lb

## 2024-04-27 DIAGNOSIS — R0609 Other forms of dyspnea: Secondary | ICD-10-CM | POA: Diagnosis not present

## 2024-04-27 DIAGNOSIS — I10 Essential (primary) hypertension: Secondary | ICD-10-CM

## 2024-04-27 DIAGNOSIS — E7849 Other hyperlipidemia: Secondary | ICD-10-CM

## 2024-04-27 DIAGNOSIS — E785 Hyperlipidemia, unspecified: Secondary | ICD-10-CM | POA: Insufficient documentation

## 2024-04-27 NOTE — Progress Notes (Signed)
 Cardiology Office Note  Date: 04/27/2024   ID: Martisha, T[place]ouse [birth year], MRN [id number]  PCP:  Toribio Jerel MATSU, MD  Cardiologist:  None Electrophysiologist:  None   History of Present Illness: Amanda Bowman is a 78 y.o. female  Referred to cardiology clinic for evaluation of DOE.  Patient is not much active at home.  Her sister lives with her.  Patient walks with walker.  She hide her niece to do the housework and kitchen work.  Patient is minimally active, cannot shower/bathe but able to feed herself.  Previously she was having dyspnea on exertion for about 1 month.  However after she started to get more physical therapy, her DOE improved.  Her PCP also started her on inhalers after which her DOE significantly improved.  Today during my interview, she denies having any DOE and said it is gone.  No angina.  No dizziness, palpitations or syncope.  No leg swelling either.  Past Medical History:  Diagnosis Date   Anxiety    GERD (gastroesophageal reflux disease)    Hypertension    Hypothyroidism    Migraine     Past Surgical History:  Procedure Laterality Date   ANKLE FRACTURE SURGERY Right    APPENDECTOMY     CHOLECYSTECTOMY     ORIF HUMERUS FRACTURE Right 01/25/2022   Procedure: OPEN REDUCTION INTERNAL FIXATION (ORIF) PROXIMAL HUMERUS FRACTURE RIGHT;  Surgeon: Josefina Chew, MD;  Location: [facility];  Service: Orthopedics;  Laterality: Right;   TONSILLECTOMY     TUBAL LIGATION      Current Outpatient Medications  Medication Sig Dispense Refill   alendronate (FOSAMAX) 70 MG tablet Take 70 mg by mouth once a week.     ALPRAZolam (XANAX) 0.5 MG tablet Take 0.5 mg by mouth 4 (four) times daily as needed for anxiety.     Calcium Carb-Cholecalciferol 600-20 MG-MCG TABS Take 1 tablet by mouth 2 (two) times daily.     cyclobenzaprine (FLEXERIL) 10 MG tablet Take 10 mg by mouth 3 (three) times daily as needed for muscle spasms.     escitalopram (LEXAPRO) 20 MG tablet  Take 20 mg by mouth daily.     hydrochlorothiazide (HYDRODIURIL) 12.5 MG tablet Take 12.5 mg by mouth daily.     lisinopril (ZESTRIL) 20 MG tablet Take 20 mg by mouth daily.     loratadine (CLARITIN) 10 MG tablet Take 10 mg by mouth daily.     Multiple Vitamins-Minerals (WOMENS MULTI VITAMIN & MINERAL) TABS Take 1 tablet by mouth daily.     omeprazole (PRILOSEC) 20 MG capsule Take 20 mg by mouth 2 (two) times daily before a meal.     ondansetron  (ZOFRAN ) 4 MG tablet Take 1 tablet (4 mg total) by mouth every 8 (eight) hours as needed for nausea or vomiting. 10 tablet 0   polyethylene glycol-electrolytes (NULYTELY/GOLYTELY) 420 g solution Take 4,000 mLs by mouth once. 4000 mL 0   risperiDONE (RISPERDAL) 1 MG tablet Take 1 mg by mouth at bedtime.     rosuvastatin (CRESTOR) 10 MG tablet Take 10 mg by mouth at bedtime.     sennosides-docusate sodium  (SENOKOT-S) 8.6-50 MG tablet Take 2 tablets by mouth daily. 30 tablet 1   topiramate (TOPAMAX) 25 MG capsule Take 25 mg by mouth 2 (two) times daily.     traZODone (DESYREL) 50 MG tablet Take 50-200 mg by mouth at bedtime.     No current facility-administered medications for this visit.   Allergies:  Codeine and Sulfa antibiotics   Social History: The patient  reports that she has never smoked. She has never used smokeless tobacco. She reports that she does not drink alcohol and does not use drugs.   Family History: The patient's family history includes Asthma in her sister; Atrial fibrillation in her mother; Hyperlipidemia in her sister.   ROS:  Please see the history of present illness. Otherwise, complete review of systems is positive for none  All other systems are reviewed and negative.   Physical Exam: VS:  There were no vitals taken for this visit., BMI There is no height or weight on file to calculate BMI.  Wt Readings from Last 3 Encounters:  01/25/22 200 lb (90.7 kg)  01/19/22 194 lb 0.1 oz (88 kg)  05/18/19 192 lb (87.1 kg)     General: Patient appears comfortable at rest. HEENT: Conjunctiva and lids normal, oropharynx clear with moist mucosa. Neck: Supple, no elevated JVP or carotid bruits, no thyromegaly. Lungs: Clear to auscultation, nonlabored breathing at rest. Cardiac: Regular rate and rhythm, no S3 or significant systolic murmur, no pericardial rub. Abdomen: Soft, nontender, no hepatomegaly, bowel sounds present, no guarding or rebound. Extremities: No pitting edema, distal pulses 2+. Skin: Warm and dry. Musculoskeletal: No kyphosis. Neuropsychiatric: Alert and oriented x3, affect grossly appropriate.  Recent Labwork: No results found for requested labs within last 365 days.  No results found for: CHOL, TRIG, HDL, CHOLHDL, VLDL, LDLCALC, LDLDIRECT   Assessment and Plan:   DOE: Resolved after starting inhalers and with physical therapy.  If she has any DOE in the future that is not related to deconditioning/pulmonary, she will need NM stress test and echocardiogram.  For now, no further cardiac workup is indicated at this time.  HTN, controlled: Continue HCTZ 12.5 mg once daily, lisinopril 20 mg once daily.  HLD, unknown values: Continue rosuvastatin 10 mg nightly.  Goal LDL less than 100.   30 minutes spent in remainder records, labs, test, discussion of her symptoms, reviewed medications and answered all her questions.  Medication Adjustments/Labs and Tests Ordered: Current medicines are reviewed at length with the patient today.  Concerns regarding medicines are outlined above.    Disposition:  Follow up as needed  Signed Grisel Blumenstock Priya Mariam Helbert, MD, 04/27/2024 1:10 PM    [facility] at [facility] [address] [place], [place], [place] [address]

## 2024-04-27 NOTE — Patient Instructions (Signed)
Medication Instructions:  Continue all current medications.  Labwork: none  Testing/Procedures: none  Follow-Up: As needed.    Any Other Special Instructions Will Be Listed Below (If Applicable).  If you need a refill on your cardiac medications before your next appointment, please call your pharmacy.  

## 2024-05-07 DIAGNOSIS — J301 Allergic rhinitis due to pollen: Secondary | ICD-10-CM | POA: Diagnosis not present

## 2024-05-07 DIAGNOSIS — G479 Sleep disorder, unspecified: Secondary | ICD-10-CM | POA: Diagnosis not present

## 2024-05-07 DIAGNOSIS — M170 Bilateral primary osteoarthritis of knee: Secondary | ICD-10-CM | POA: Diagnosis not present

## 2024-05-07 DIAGNOSIS — M5136 Other intervertebral disc degeneration, lumbar region with discogenic back pain only: Secondary | ICD-10-CM | POA: Diagnosis not present

## 2024-05-07 DIAGNOSIS — I10 Essential (primary) hypertension: Secondary | ICD-10-CM | POA: Diagnosis not present

## 2024-05-07 DIAGNOSIS — E782 Mixed hyperlipidemia: Secondary | ICD-10-CM | POA: Diagnosis not present

## 2024-05-07 DIAGNOSIS — K219 Gastro-esophageal reflux disease without esophagitis: Secondary | ICD-10-CM | POA: Diagnosis not present

## 2024-05-07 DIAGNOSIS — G43909 Migraine, unspecified, not intractable, without status migrainosus: Secondary | ICD-10-CM | POA: Diagnosis not present

## 2024-05-07 DIAGNOSIS — E039 Hypothyroidism, unspecified: Secondary | ICD-10-CM | POA: Diagnosis not present

## 2024-05-12 ENCOUNTER — Ambulatory Visit: Admitting: Physical Medicine and Rehabilitation

## 2024-05-12 ENCOUNTER — Other Ambulatory Visit: Payer: Self-pay

## 2024-05-12 VITALS — BP 144/83 | HR 90

## 2024-05-12 DIAGNOSIS — M47816 Spondylosis without myelopathy or radiculopathy, lumbar region: Secondary | ICD-10-CM

## 2024-05-12 MED ORDER — METHYLPREDNISOLONE ACETATE 40 MG/ML IJ SUSP
40.0000 mg | Freq: Once | INTRAMUSCULAR | Status: AC
  Administered 2024-05-12: 40 mg

## 2024-05-12 NOTE — Progress Notes (Unsigned)
 Pain Scale   Average Pain 6 Patient advising she has chronic lower back pain that is constant.        +Driver, -BT, -Dye Allergies.

## 2024-05-12 NOTE — Progress Notes (Unsigned)
   Amanda Bowman - 78 y.o. female MRN [id number]  Date of birth: [birth year]  Office Visit Note: Visit Date: 05/12/2024 PCP: Toribio Jerel MATSU, MD Referred by: Toribio Jerel MATSU, MD  Subjective: No chief complaint on file.  HPI:  Amanda Bowman is a 78 y.o. female who comes in todayHPI ROS Otherwise per HPI.  Assessment & Plan: Visit Diagnoses:    ICD-10-CM   1. Spondylosis without myelopathy or radiculopathy, lumbar region  M47.816 XR C-ARM NO REPORT    Facet Injection    methylPREDNISolone  acetate (DEPO-MEDROL ) injection 40 mg      Plan: No additional findings.   Meds & Orders:  Meds ordered this encounter  Medications   methylPREDNISolone  acetate (DEPO-MEDROL ) injection 40 mg    Orders Placed This Encounter  Procedures   Facet Injection   XR C-ARM NO REPORT    Follow-up: No follow-ups on file.   Procedures: No procedures performed      Clinical History: MRI LUMBAR SPINE WITHOUT CONTRAST   TECHNIQUE: Multiplanar, multisequence MR imaging of the lumbar spine was performed. No intravenous contrast was administered.   COMPARISON:  None.   FINDINGS: Segmentation:  Standard.   Alignment:  2 mm retrolisthesis of L4 on L5.   Vertebrae: No acute fracture, evidence of discitis, or aggressive bone lesion.   Conus medullaris and cauda equina: Conus extends above T12. Conus and cauda equina appear normal.   Paraspinal and other soft tissues: No acute paraspinal abnormality.   Disc levels:   Disc spaces: Degenerative disease with disc height loss at L4-5. Disc desiccation at L3-4 and L5-S1.   T12-L1: No significant disc bulge. No neural foraminal stenosis. No central canal stenosis.   L1-L2: No significant disc bulge. No neural foraminal stenosis. No central canal stenosis.   L2-L3: No significant disc bulge. No neural foraminal stenosis. No central canal stenosis.   L3-L4: Mild broad-based disc bulge. Right perineural cyst. No foraminal or central  canal stenosis.   L4-L5: Broad-based disc bulge. Mild bilateral facet arthropathy. Moderate right foraminal stenosis. No left foraminal stenosis. No spinal stenosis. Bilateral lateral recess stenosis.   L5-S1: No significant disc bulge. No neural foraminal stenosis. No central canal stenosis. Left perineural cyst.   IMPRESSION: 1.  No acute osseous injury of the lumbar spine. 2. Lower lumbar spine spondylosis as described above.     Electronically Signed   By: Julaine Blanch M.D.   On: 07/21/2021 15:04     Objective:  VS:  HT:    WT:   BMI:     BP:   HR: bpm  TEMP: ( )  RESP:  Physical Exam   Imaging: No results found.

## 2024-05-14 NOTE — Procedures (Signed)
 Lumbar Facet Joint Intra-Articular Injection(s) with Fluoroscopic Guidance  Patient: Amanda Bowman      Date of Birth: 1945-10-21 MRN: [id number] PCP: Toribio Jerel MATSU, MD      Visit Date: 05/12/2024   Universal Protocol:    Date/Time: 05/12/2024  Consent Given By: the patient  Position: PRONE   Additional Comments: Vital signs were monitored before and after the procedure. Patient was prepped and draped in the usual sterile fashion. The correct patient, procedure, and site was verified.   Injection Procedure Details:  Procedure Site One Meds Administered:  Meds ordered this encounter  Medications   methylPREDNISolone  acetate (DEPO-MEDROL ) injection 40 mg     Laterality: Bilateral  Location/Site:  L4-L5  Needle size: 22 guage  Needle type: Spinal  Needle Placement: Articular  Findings:  -Comments: Excellent flow of contrast producing a partial arthrogram.  Procedure Details: The fluoroscope beam is vertically oriented in AP, and the inferior recess is visualized beneath the lower pole of the inferior apophyseal process, which represents the target point for needle insertion. When direct visualization is difficult the target point is located at the medial projection of the vertebral pedicle. The region overlying each aforementioned target is locally anesthetized with a 1 to 2 ml. volume of 1% Lidocaine  without Epinephrine.   The spinal needle was inserted into each of the above mentioned facet joints using biplanar fluoroscopic guidance. A 0.25 to 0.5 ml. volume of Isovue-250 was injected and a partial facet joint arthrogram was obtained. A single spot film was obtained of the resulting arthrogram.    One to 1.25 ml of the steroid/anesthetic solution was then injected into each of the facet joints noted above.   Additional Comments:  The patient tolerated the procedure well Dressing: 2 x 2 sterile gauze and Band-Aid    Post-procedure details: Patient was  observed during the procedure. Post-procedure instructions were reviewed.  Patient left the clinic in stable condition.

## 2024-05-17 DIAGNOSIS — I10 Essential (primary) hypertension: Secondary | ICD-10-CM | POA: Diagnosis not present

## 2024-05-17 DIAGNOSIS — E039 Hypothyroidism, unspecified: Secondary | ICD-10-CM | POA: Diagnosis not present

## 2024-05-17 DIAGNOSIS — E782 Mixed hyperlipidemia: Secondary | ICD-10-CM | POA: Diagnosis not present

## 2024-05-17 DIAGNOSIS — K219 Gastro-esophageal reflux disease without esophagitis: Secondary | ICD-10-CM | POA: Diagnosis not present

## 2024-05-26 DIAGNOSIS — Z780 Asymptomatic menopausal state: Secondary | ICD-10-CM | POA: Diagnosis not present

## 2024-05-31 DIAGNOSIS — E7849 Other hyperlipidemia: Secondary | ICD-10-CM | POA: Diagnosis not present

## 2024-05-31 DIAGNOSIS — E876 Hypokalemia: Secondary | ICD-10-CM | POA: Diagnosis not present

## 2024-05-31 DIAGNOSIS — E039 Hypothyroidism, unspecified: Secondary | ICD-10-CM | POA: Diagnosis not present

## 2024-05-31 DIAGNOSIS — Z0001 Encounter for general adult medical examination with abnormal findings: Secondary | ICD-10-CM | POA: Diagnosis not present

## 2024-05-31 DIAGNOSIS — Z131 Encounter for screening for diabetes mellitus: Secondary | ICD-10-CM | POA: Diagnosis not present

## 2024-06-03 DIAGNOSIS — L570 Actinic keratosis: Secondary | ICD-10-CM | POA: Diagnosis not present

## 2024-06-03 DIAGNOSIS — E782 Mixed hyperlipidemia: Secondary | ICD-10-CM | POA: Diagnosis not present

## 2024-06-03 DIAGNOSIS — J301 Allergic rhinitis due to pollen: Secondary | ICD-10-CM | POA: Diagnosis not present

## 2024-06-03 DIAGNOSIS — G43909 Migraine, unspecified, not intractable, without status migrainosus: Secondary | ICD-10-CM | POA: Diagnosis not present

## 2024-06-03 DIAGNOSIS — E559 Vitamin D deficiency, unspecified: Secondary | ICD-10-CM | POA: Diagnosis not present

## 2024-06-03 DIAGNOSIS — Z23 Encounter for immunization: Secondary | ICD-10-CM | POA: Diagnosis not present

## 2024-06-04 DIAGNOSIS — E039 Hypothyroidism, unspecified: Secondary | ICD-10-CM | POA: Diagnosis not present

## 2024-06-04 DIAGNOSIS — I10 Essential (primary) hypertension: Secondary | ICD-10-CM | POA: Diagnosis not present

## 2024-06-04 DIAGNOSIS — G479 Sleep disorder, unspecified: Secondary | ICD-10-CM | POA: Diagnosis not present

## 2024-06-04 DIAGNOSIS — M170 Bilateral primary osteoarthritis of knee: Secondary | ICD-10-CM | POA: Diagnosis not present

## 2024-06-04 DIAGNOSIS — J301 Allergic rhinitis due to pollen: Secondary | ICD-10-CM | POA: Diagnosis not present

## 2024-06-04 DIAGNOSIS — E782 Mixed hyperlipidemia: Secondary | ICD-10-CM | POA: Diagnosis not present

## 2024-06-04 DIAGNOSIS — R7989 Other specified abnormal findings of blood chemistry: Secondary | ICD-10-CM | POA: Diagnosis not present

## 2024-06-04 DIAGNOSIS — G43909 Migraine, unspecified, not intractable, without status migrainosus: Secondary | ICD-10-CM | POA: Diagnosis not present

## 2024-06-04 DIAGNOSIS — K219 Gastro-esophageal reflux disease without esophagitis: Secondary | ICD-10-CM | POA: Diagnosis not present

## 2024-06-04 DIAGNOSIS — E871 Hypo-osmolality and hyponatremia: Secondary | ICD-10-CM | POA: Diagnosis not present

## 2024-06-04 DIAGNOSIS — M5136 Other intervertebral disc degeneration, lumbar region with discogenic back pain only: Secondary | ICD-10-CM | POA: Diagnosis not present

## 2024-06-07 DIAGNOSIS — B079 Viral wart, unspecified: Secondary | ICD-10-CM | POA: Diagnosis not present

## 2024-06-11 DIAGNOSIS — E039 Hypothyroidism, unspecified: Secondary | ICD-10-CM | POA: Diagnosis not present

## 2024-06-11 DIAGNOSIS — E876 Hypokalemia: Secondary | ICD-10-CM | POA: Diagnosis not present

## 2024-06-21 DIAGNOSIS — R928 Other abnormal and inconclusive findings on diagnostic imaging of breast: Secondary | ICD-10-CM | POA: Diagnosis not present

## 2024-06-21 DIAGNOSIS — N6001 Solitary cyst of right breast: Secondary | ICD-10-CM | POA: Diagnosis not present

## 2024-06-22 DIAGNOSIS — E039 Hypothyroidism, unspecified: Secondary | ICD-10-CM | POA: Diagnosis not present

## 2024-06-22 DIAGNOSIS — R531 Weakness: Secondary | ICD-10-CM | POA: Diagnosis not present

## 2024-06-22 DIAGNOSIS — R0609 Other forms of dyspnea: Secondary | ICD-10-CM | POA: Diagnosis not present

## 2024-06-22 DIAGNOSIS — R918 Other nonspecific abnormal finding of lung field: Secondary | ICD-10-CM | POA: Diagnosis not present

## 2024-06-22 DIAGNOSIS — I2510 Atherosclerotic heart disease of native coronary artery without angina pectoris: Secondary | ICD-10-CM | POA: Diagnosis not present

## 2024-06-22 DIAGNOSIS — R0602 Shortness of breath: Secondary | ICD-10-CM | POA: Diagnosis not present

## 2024-06-22 DIAGNOSIS — E785 Hyperlipidemia, unspecified: Secondary | ICD-10-CM | POA: Diagnosis not present

## 2024-06-22 DIAGNOSIS — I10 Essential (primary) hypertension: Secondary | ICD-10-CM | POA: Diagnosis not present

## 2024-06-22 DIAGNOSIS — J9811 Atelectasis: Secondary | ICD-10-CM | POA: Diagnosis not present

## 2024-06-24 DIAGNOSIS — E559 Vitamin D deficiency, unspecified: Secondary | ICD-10-CM | POA: Diagnosis not present

## 2024-06-24 DIAGNOSIS — G43909 Migraine, unspecified, not intractable, without status migrainosus: Secondary | ICD-10-CM | POA: Diagnosis not present

## 2024-06-24 DIAGNOSIS — E782 Mixed hyperlipidemia: Secondary | ICD-10-CM | POA: Diagnosis not present

## 2024-06-24 DIAGNOSIS — J301 Allergic rhinitis due to pollen: Secondary | ICD-10-CM | POA: Diagnosis not present

## 2024-06-30 DIAGNOSIS — J301 Allergic rhinitis due to pollen: Secondary | ICD-10-CM | POA: Diagnosis not present

## 2024-06-30 DIAGNOSIS — R7989 Other specified abnormal findings of blood chemistry: Secondary | ICD-10-CM | POA: Diagnosis not present

## 2024-06-30 DIAGNOSIS — I10 Essential (primary) hypertension: Secondary | ICD-10-CM | POA: Diagnosis not present

## 2024-06-30 DIAGNOSIS — K219 Gastro-esophageal reflux disease without esophagitis: Secondary | ICD-10-CM | POA: Diagnosis not present

## 2024-06-30 DIAGNOSIS — G479 Sleep disorder, unspecified: Secondary | ICD-10-CM | POA: Diagnosis not present

## 2024-06-30 DIAGNOSIS — E039 Hypothyroidism, unspecified: Secondary | ICD-10-CM | POA: Diagnosis not present

## 2024-06-30 DIAGNOSIS — G43909 Migraine, unspecified, not intractable, without status migrainosus: Secondary | ICD-10-CM | POA: Diagnosis not present

## 2024-06-30 DIAGNOSIS — E871 Hypo-osmolality and hyponatremia: Secondary | ICD-10-CM | POA: Diagnosis not present

## 2024-06-30 DIAGNOSIS — M170 Bilateral primary osteoarthritis of knee: Secondary | ICD-10-CM | POA: Diagnosis not present

## 2024-06-30 DIAGNOSIS — M5136 Other intervertebral disc degeneration, lumbar region with discogenic back pain only: Secondary | ICD-10-CM | POA: Diagnosis not present

## 2024-06-30 DIAGNOSIS — E782 Mixed hyperlipidemia: Secondary | ICD-10-CM | POA: Diagnosis not present

## 2024-07-09 DIAGNOSIS — I10 Essential (primary) hypertension: Secondary | ICD-10-CM | POA: Diagnosis not present

## 2024-09-14 ENCOUNTER — Encounter: Payer: Self-pay | Admitting: Neurology

## 2024-10-21 ENCOUNTER — Other Ambulatory Visit (HOSPITAL_COMMUNITY): Payer: Self-pay | Admitting: Family Medicine

## 2024-10-21 DIAGNOSIS — E0590 Thyrotoxicosis, unspecified without thyrotoxic crisis or storm: Secondary | ICD-10-CM

## 2024-12-20 ENCOUNTER — Ambulatory Visit: Payer: Self-pay | Admitting: Neurology
